# Patient Record
Sex: Male | Born: 1955
Health system: Southern US, Community
[De-identification: ages and names within clinical notes are randomized; demographics above are authoritative.]

## PROBLEM LIST (undated history)

## (undated) DIAGNOSIS — E785 Hyperlipidemia, unspecified: Secondary | ICD-10-CM

## (undated) DIAGNOSIS — G709 Myoneural disorder, unspecified: Secondary | ICD-10-CM

## (undated) DIAGNOSIS — Z86010 Personal history of colonic polyps: Secondary | ICD-10-CM

## (undated) DIAGNOSIS — C801 Malignant (primary) neoplasm, unspecified: Secondary | ICD-10-CM

## (undated) DIAGNOSIS — M1990 Unspecified osteoarthritis, unspecified site: Secondary | ICD-10-CM

## (undated) DIAGNOSIS — H269 Unspecified cataract: Secondary | ICD-10-CM

## (undated) HISTORY — DX: Unspecified osteoarthritis, unspecified site: M19.90

## (undated) HISTORY — DX: Personal history of colonic polyps: Z86.010

## (undated) HISTORY — PX: OTHER SURGICAL HISTORY: SHX169

## (undated) HISTORY — DX: Myoneural disorder, unspecified: G70.9

## (undated) HISTORY — PX: TOOTH EXTRACTION: SUR596

## (undated) HISTORY — PX: VASECTOMY: SHX75

## (undated) HISTORY — DX: Malignant (primary) neoplasm, unspecified: C80.1

## (undated) HISTORY — PX: COLONOSCOPY: SHX174

## (undated) HISTORY — DX: Unspecified cataract: H26.9

## (undated) HISTORY — DX: Hyperlipidemia, unspecified: E78.5

---

## 1999-09-22 ENCOUNTER — Encounter: Payer: Self-pay | Admitting: Internal Medicine

## 1999-09-22 ENCOUNTER — Ambulatory Visit (HOSPITAL_COMMUNITY): Admission: RE | Admit: 1999-09-22 | Discharge: 1999-09-22 | Payer: Self-pay | Admitting: Internal Medicine

## 2006-05-07 ENCOUNTER — Ambulatory Visit: Payer: Self-pay | Admitting: Internal Medicine

## 2007-07-22 ENCOUNTER — Ambulatory Visit: Payer: Self-pay | Admitting: Internal Medicine

## 2007-07-22 LAB — CONVERTED CEMR LAB
ALT: 34 units/L (ref 0–53)
AST: 21 units/L (ref 0–37)
Albumin: 3.9 g/dL (ref 3.5–5.2)
Alkaline Phosphatase: 46 units/L (ref 39–117)
BUN: 14 mg/dL (ref 6–23)
Basophils Absolute: 0 10*3/uL (ref 0.0–0.1)
Basophils Relative: 0.3 % (ref 0.0–1.0)
Bilirubin Urine: NEGATIVE
Bilirubin, Direct: 0.2 mg/dL (ref 0.0–0.3)
Blood in Urine, dipstick: NEGATIVE
CO2: 31 meq/L (ref 19–32)
Calcium: 9 mg/dL (ref 8.4–10.5)
Chloride: 110 meq/L (ref 96–112)
Cholesterol: 216 mg/dL (ref 0–200)
Creatinine, Ser: 1 mg/dL (ref 0.4–1.5)
Direct LDL: 154.9 mg/dL
Eosinophils Absolute: 0.2 10*3/uL (ref 0.0–0.7)
Eosinophils Relative: 4.4 % (ref 0.0–5.0)
GFR calc Af Amer: 102 mL/min
GFR calc non Af Amer: 84 mL/min
Glucose, Bld: 92 mg/dL (ref 70–99)
Glucose, Urine, Semiquant: NEGATIVE
HCT: 38.4 % — ABNORMAL LOW (ref 39.0–52.0)
HDL: 46.9 mg/dL (ref >39.0)
Hemoglobin: 13.1 g/dL (ref 13.0–17.0)
Ketones, urine, test strip: NEGATIVE
Lymphocytes Relative: 30.2 % (ref 12.0–46.0)
MCHC: 34.1 g/dL (ref 30.0–36.0)
MCV: 86.6 fL (ref 78.0–100.0)
Monocytes Absolute: 0.3 10*3/uL (ref 0.1–1.0)
Monocytes Relative: 7.7 % (ref 3.0–12.0)
Neutro Abs: 2.6 10*3/uL (ref 1.4–7.7)
Neutrophils Relative %: 57.4 % (ref 43.0–77.0)
Nitrite: NEGATIVE
PSA: 1.65 ng/mL (ref 0.10–4.00)
Platelets: 218 10*3/uL (ref 150–400)
Potassium: 5.1 meq/L (ref 3.5–5.1)
Protein, U semiquant: NEGATIVE
RBC: 4.43 M/uL (ref 4.22–5.81)
RDW: 11.8 % (ref 11.5–14.6)
Sodium: 145 meq/L (ref 135–145)
Specific Gravity, Urine: 1.01
TSH: 1.08 microintl units/mL (ref 0.35–5.50)
Total Bilirubin: 0.9 mg/dL (ref 0.3–1.2)
Total CHOL/HDL Ratio: 4.6
Total Protein: 5.9 g/dL — ABNORMAL LOW (ref 6.0–8.3)
Triglycerides: 94 mg/dL (ref 0–149)
Urobilinogen, UA: 0.2
VLDL: 19 mg/dL (ref 0–40)
WBC Urine, dipstick: NEGATIVE
WBC: 4.4 10*3/uL — ABNORMAL LOW (ref 4.5–10.5)
pH: 6.5

## 2007-08-01 ENCOUNTER — Ambulatory Visit: Payer: Self-pay | Admitting: Internal Medicine

## 2007-08-01 DIAGNOSIS — M722 Plantar fascial fibromatosis: Secondary | ICD-10-CM | POA: Insufficient documentation

## 2007-08-05 ENCOUNTER — Telehealth: Payer: Self-pay | Admitting: Internal Medicine

## 2007-08-30 ENCOUNTER — Telehealth: Payer: Self-pay | Admitting: Internal Medicine

## 2007-10-08 ENCOUNTER — Ambulatory Visit: Payer: Self-pay | Admitting: Internal Medicine

## 2007-10-22 ENCOUNTER — Ambulatory Visit: Payer: Self-pay | Admitting: Internal Medicine

## 2007-10-22 LAB — HM COLONOSCOPY: HM COLON: NORMAL

## 2009-07-14 ENCOUNTER — Ambulatory Visit: Payer: Self-pay | Admitting: Internal Medicine

## 2010-05-17 NOTE — Assessment & Plan Note (Signed)
Summary: eye inf/cjr   Vital Signs:  Patient profile:   55 year old male Weight:      176 pounds Temp:     97.6 degrees F oral BP sitting:   108 / 70  (right arm) Cuff size:   regular  Vitals Entered By: Duard Brady LPN (July 14, 2009 10:20 AM) CC: c/o (R) eye redness and drainage since Sunday - no injury or scratch/rubbing noted Is Patient Diabetic? No   CC:  c/o (R) eye redness and drainage since Sunday - no injury or scratch/rubbing noted.  History of Present Illness: 55 year old patient with a 3-day history of redness and excessive tearing from the right eye.  The eye has a slight foreign body sensation, but no pain, change in visual acuity or photophobia.  He has had no activities that puts him at high risk for a foreign body.  Preventive Screening-Counseling & Management  Alcohol-Tobacco     Smoking Status: never  Allergies (verified): No Known Drug Allergies  Past History:  Past Medical History: Reviewed history from 08/01/2007 and no changes required. mitral valve prolapse  Review of Systems  The patient denies anorexia, fever, weight loss, weight gain, vision loss, decreased hearing, hoarseness, chest pain, syncope, dyspnea on exertion, peripheral edema, prolonged cough, headaches, hemoptysis, abdominal pain, melena, hematochezia, severe indigestion/heartburn, hematuria, incontinence, genital sores, muscle weakness, suspicious skin lesions, transient blindness, difficulty walking, depression, unusual weight change, abnormal bleeding, enlarged lymph nodes, angioedema, breast masses, and testicular masses.    Physical Exam  General:  Well-developed,well-nourished,in no acute distress; alert,appropriate and cooperative throughout examination Head:  Normocephalic and atraumatic without obvious abnormalities. No apparent alopecia or balding. Eyes:  left eye was normal except for some small lenticular opacities; diffuse conjunctiva injection noted on the right.   No exudate noted.  Pupillary responses were normal; excessive tearing noted Ears:  External ear exam shows no significant lesions or deformities.  Otoscopic examination reveals clear canals, tympanic membranes are intact bilaterally without bulging, retraction, inflammation or discharge. Hearing is grossly normal bilaterally. Mouth:  Oral mucosa and oropharynx without lesions or exudates.  Teeth in good repair.   Impression & Recommendations:  Problem # 1:  CONJUNCTIVITIS (ICD-372.30)  His updated medication list for this problem includes:    Neomycin-polymyxin-gramicidin Soln (Neomycin-polymyx-gramicid) ..... 2 gtts  od four times daily  Complete Medication List: 1)  Neomycin-polymyxin-gramicidin Soln (Neomycin-polymyx-gramicid) .... 2 gtts  od four times daily  Patient Instructions: 1)  Please schedule a follow-up appointment as needed. 2)  ophthalmic drops as prescribed 3)  call if  eye  pain, redness, or change in visual acuity Prescriptions: NEOMYCIN-POLYMYXIN-GRAMICIDIN  SOLN (NEOMYCIN-POLYMYX-GRAMICID) 2 gtts  OD four times daily  #10 cc x 1   Entered and Authorized by:   Hershey Knauer F Ashleen Demma  MD   Signed by:   Ilyssa Grennan F Summerlynn Glauser  MD on 07/14/2009   Method used:   Electronically to        [facility]  [address]  [id number] ([facility])       [address] [place], [place]  [address]       Ph: [phone] or [phone]       Fax: [phone]   RxID:   [id number] NEOMYCIN-POLYMYXIN-GRAMICIDIN  SOLN (NEOMYCIN-POLYMYX-GRAMICID) 2 gtts  OD four times daily  #10 cc x 0   Entered and Authorized by:   Gordy Savers  MD   Signed by:   Gordy Savers  MD on 07/14/2009  Method used:   Print then Give to Patient   RxID:   [id number]

## 2012-07-20 ENCOUNTER — Emergency Department (HOSPITAL_COMMUNITY)
Admission: EM | Admit: 2012-07-20 | Discharge: 2012-07-20 | Disposition: A | Discharge status: Home / Self Care | Payer: 59 | Source: Home / Self Care | Attending: Emergency Medicine | Admitting: Emergency Medicine

## 2012-07-20 ENCOUNTER — Emergency Department (INDEPENDENT_AMBULATORY_CARE_PROVIDER_SITE_OTHER): Payer: 59

## 2012-07-20 ENCOUNTER — Encounter (HOSPITAL_COMMUNITY): Payer: Self-pay | Admitting: Emergency Medicine

## 2012-07-20 DIAGNOSIS — J40 Bronchitis, not specified as acute or chronic: Secondary | ICD-10-CM

## 2012-07-20 MED ORDER — HYDROCOD POLST-CHLORPHEN POLST 10-8 MG/5ML PO LQCR: 5.0000 mL | Freq: Every evening | ORAL | Status: DC | PRN

## 2012-07-20 MED ORDER — AZITHROMYCIN 250 MG PO TABS: ORAL_TABLET | ORAL | Status: DC

## 2012-07-20 MED ORDER — ALBUTEROL SULFATE HFA 108 (90 BASE) MCG/ACT IN AERS: 1.0000 | INHALATION_SPRAY | Freq: Four times a day (QID) | RESPIRATORY_TRACT | Status: DC | PRN

## 2012-07-20 NOTE — ED Notes (Signed)
Reports onset two weeks ago of cough and fever, aches and pains, and congestion. All bt cough have resolved.  Reports nagging, persistent cough.

## 2012-07-22 NOTE — ED Provider Notes (Signed)
History     CSN: [id number]  Arrival date & time 07/20/12  1147   First MD Initiated Contact with Patient 07/20/12 1234      Chief Complaint  Patient presents with  . Cough    HPI: Patient is a 57 y.o. male presenting with cough. The history is provided by the patient.  Cough Cough characteristics:  Productive and harsh Sputum characteristics:  Unable to specify Severity:  Moderate Onset quality:  Gradual Duration:  2 weeks Timing:  Intermittent Progression:  Unchanged Chronicity:  New Smoker: no   Context: sick contacts and upper respiratory infection   Context: not animal exposure, not exposure to allergens, not fumes, not occupational exposure, not smoke exposure, not weather changes and not with activity   Relieved by:  Nothing Worsened by:  Deep breathing Ineffective treatments:  None tried Associated symptoms: no chest pain, no chills, no ear pain, no fever, no headaches, no myalgias, no rhinorrhea, no shortness of breath, no sinus congestion, no sore throat and no wheezing   Risk factors: recent infection   Pt reports he and his wife both had flu-like symptoms approx 2 weeks ago that included body aches, fever, congestion and cough. His wife's symptoms resolved completely after a few days and all of pt's symptoms resolved except his cough. Reports persistent, frequent cough that is at times productive and worse at night. Has not had fever in several days.   History reviewed. No pertinent past medical history.  Past Surgical History  Procedure Laterality Date  . Tooth extraction      No family history on file.  History  Substance Use Topics  . Smoking status: Never Smoker   . Smokeless tobacco: Not on file  . Alcohol Use: Yes      Review of Systems  Constitutional: Negative for fever and chills.  HENT: Negative for ear pain, congestion, sore throat, rhinorrhea, sneezing, postnasal drip and sinus pressure.   Eyes: Negative.   Respiratory: Positive for  cough. Negative for shortness of breath and wheezing.   Cardiovascular: Negative for chest pain.  Gastrointestinal: Negative for nausea, vomiting, abdominal pain, diarrhea and abdominal distention.  Endocrine: Negative.   Genitourinary: Negative.   Musculoskeletal: Negative.  Negative for myalgias.  Skin: Negative.   Allergic/Immunologic: Negative.   Neurological: Negative.  Negative for headaches.  Hematological: Negative.   Psychiatric/Behavioral: Negative.     Allergies  Review of patient's allergies indicates no known allergies.  Home Medications   Current Outpatient Rx  Name  Route  Sig  Dispense  Refill  . aspirin 81 MG tablet   Oral   Take 81 mg by mouth daily.         . Naproxen Sodium (ALEVE PO)   Oral   Take by mouth.         Marland Kitchen OVER THE COUNTER MEDICATION      Cough medicine.         Marland Kitchen albuterol (PROVENTIL HFA;VENTOLIN HFA) 108 (90 BASE) MCG/ACT inhaler   Inhalation   Inhale 1-2 puffs into the lungs every 6 (six) hours as needed for wheezing or shortness of breath (and or cough).   1 Inhaler   0   . azithromycin (ZITHROMAX Z-PAK) 250 MG tablet      Take 2 tabs on day 1 then 2 tabs on days 2-5   6 tablet   0   . chlorpheniramine-HYDROcodone (TUSSIONEX PENNKINETIC ER) 10-8 MG/5ML LQCR   Oral   Take 5 mLs by mouth at  bedtime as needed.   50 mL   0     BP 111/68  Pulse 82  Temp(Src) 99.1 F (37.3 C) (Oral)  Resp 16  SpO2 100%  Physical Exam  Nursing note and vitals reviewed. Constitutional: He is oriented to person, place, and time. He appears well-developed and well-nourished. No distress.  HENT:  Head: Normocephalic and atraumatic.  Right Ear: External ear normal.  Left Ear: External ear normal.  Nose: Nose normal.  Mouth/Throat: Oropharynx is clear and moist.  Neck: Neck supple.  Cardiovascular: Normal rate and regular rhythm.   Pulmonary/Chest: Effort normal and breath sounds normal.  Musculoskeletal: Normal range of motion.   Neurological: He is alert and oriented to person, place, and time.  Skin: Skin is warm and dry.  Psychiatric: He has a normal mood and affect.    ED Course  Procedures (including critical care time)  Labs Reviewed - No data to display No results found.   1. Bronchitis       MDM  Persistent cough s/p recent URI. Productive at times. Worse at night. Non-smoker. No fever in several days. All other sx's have resolved. CXR negative. Will treat for Bronchitis (Z-Pack, albuterol inhaler and short course of med for cough to use primarily at night. Pt is to f/u w/ PCP if not improving. Pt agreeable.        Leanne Chang, NP 07/22/12 [phone]

## 2012-07-23 NOTE — ED Provider Notes (Signed)
Medical screening examination/treatment/procedure(s) were performed by non-physician practitioner and as supervising physician I was immediately available for consultation/collaboration.  Ahamed Dorothie Wah, M.D.  Roe C Vuk Skillern, MD 07/23/12 1958 

## 2014-07-13 ENCOUNTER — Telehealth: Payer: Self-pay | Admitting: Internal Medicine

## 2014-07-13 NOTE — Telephone Encounter (Signed)
All ok

## 2014-07-13 NOTE — Telephone Encounter (Signed)
Pt has been sch

## 2014-07-13 NOTE — Telephone Encounter (Signed)
yes

## 2014-07-13 NOTE — Telephone Encounter (Signed)
Pt not seen in over 5+ yrs and is having upper back pain.  Would like to know if you will accept him back as pt and see him for that asap.

## 2014-07-13 NOTE — Telephone Encounter (Signed)
Pt would like to come in for back pain asap. Can I use sda slot for issue only and then pt will est later?

## 2014-07-14 ENCOUNTER — Ambulatory Visit (INDEPENDENT_AMBULATORY_CARE_PROVIDER_SITE_OTHER): Payer: BLUE CROSS/BLUE SHIELD | Admitting: Internal Medicine

## 2014-07-14 ENCOUNTER — Encounter: Payer: Self-pay | Admitting: Internal Medicine

## 2014-07-14 VITALS — BP 140/80 | HR 72 | Temp 98.0°F | Resp 20 | Ht 71.25 in | Wt 190.0 lb

## 2014-07-14 DIAGNOSIS — R29898 Other symptoms and signs involving the musculoskeletal system: Secondary | ICD-10-CM | POA: Diagnosis not present

## 2014-07-14 NOTE — Progress Notes (Signed)
Pre visit review using our clinic review tool, if applicable. No additional management support is needed unless otherwise documented below in the visit note. 

## 2014-07-14 NOTE — Patient Instructions (Addendum)
Cervical spine MRI as discussed  Call if any clinical change in your status  HOME CARE INSTRUCTIONS   Use a flat pillow when you sleep.  Only take over-the-counter or prescription medicines for pain, discomfort, or fever as directed by your caregiver.   SEEK IMMEDIATE MEDICAL CARE IF:  Your pain gets much worse and cannot be controlled with medicines.  You have weakness or numbness in your hand, arm, face, or leg.  You have a high fever or a stiff, rigid neck.  You lose bowel or bladder control (incontinence).  You have trouble with walking, balance, or speaking.

## 2014-07-14 NOTE — Progress Notes (Signed)
   Subjective:    Patient ID: John Hendricks, male    DOB: [birth year], 59 y.o.   MRN: [id number]  HPI  59 year old patient who presents with a two-week history of upper back discomfort.  He's also noticed some weakness and numbness involving his right arm.  No history of trauma, although he states he spends hours in a sofa in a awkward position typing before the onset of his symptoms.  He plays golf, but not in approximately 3 weeks.  He states that he does not feel he would be able to play golf due to his present limitations.  He describes weakness in the right hand grip, interfering with handwriting.  He also describes a lack of coordination with use of the right hand  Current Allergies: No known allergies   Past Medical History:  Reviewed history and no changes required:  mitral valve prolapse  Past Surgical History:  status post vasectomy 1985  status post tonsillectomy 1968   Family History:  father 70, and history Parkinson's disease, prostate cancer at age 46  mother, age 27, breast cancer, history of MI, age 31, status post stenting x 2    One sister in good health  Social History:  Married  Never Smoked  Ph.D. in geophysics    Two adult children, one is a first year Careers information officer at [employer]  Constitutional: Negative for fever, chills, appetite change and fatigue.  HENT: Negative for congestion, dental problem, ear pain, hearing loss, sore throat, tinnitus, trouble swallowing and voice change.   Eyes: Negative for pain, discharge and visual disturbance.  Respiratory: Negative for cough, chest tightness, wheezing and stridor.   Cardiovascular: Negative for chest pain, palpitations and leg swelling.  Gastrointestinal: Negative for nausea, vomiting, abdominal pain, diarrhea, constipation, blood in stool and abdominal distention.  Genitourinary: Negative for urgency, hematuria, flank pain, discharge, difficulty urinating  and genital sores.  Musculoskeletal: Negative for myalgias, back pain, joint swelling, arthralgias, gait problem and neck stiffness.  Skin: Negative for rash.  Neurological: Positive for weakness and numbness. Negative for dizziness, syncope, speech difficulty and headaches.  Hematological: Negative for adenopathy. Does not bruise/bleed easily.  Psychiatric/Behavioral: Negative for behavioral problems and dysphoric mood. The patient is not nervous/anxious.        Objective:   Physical Exam  Constitutional: He is oriented to person, place, and time. He appears well-developed and well-nourished. No distress.  Neurological: He is alert and oriented to person, place, and time.  Full range of motion of the head or neck stiffness; did Not tend aggravate shoulder or right upper back pain  Normal shoulder shrug The right biceps and triceps reflexes diminished Decreased right grip strength Slight hip.  This lesion involving the inner aspect of the right arm           Assessment & Plan:    Suspect cervical radiculopathy.  We'll set up for a prompt MRI.  Likely will need neurosurgical referral

## 2014-07-15 ENCOUNTER — Ambulatory Visit (HOSPITAL_COMMUNITY)
Admission: RE | Admit: 2014-07-15 | Discharge: 2014-07-15 | Disposition: A | Discharge status: Home / Self Care | Payer: BLUE CROSS/BLUE SHIELD | Source: Ambulatory Visit | Attending: Internal Medicine | Admitting: Internal Medicine

## 2014-07-15 DIAGNOSIS — M4802 Spinal stenosis, cervical region: Secondary | ICD-10-CM | POA: Diagnosis not present

## 2014-07-15 DIAGNOSIS — R29898 Other symptoms and signs involving the musculoskeletal system: Secondary | ICD-10-CM

## 2014-07-15 DIAGNOSIS — M542 Cervicalgia: Secondary | ICD-10-CM | POA: Diagnosis present

## 2014-07-16 ENCOUNTER — Other Ambulatory Visit: Payer: Self-pay | Admitting: Internal Medicine

## 2014-07-16 DIAGNOSIS — R202 Paresthesia of skin: Secondary | ICD-10-CM

## 2014-07-16 DIAGNOSIS — R29898 Other symptoms and signs involving the musculoskeletal system: Secondary | ICD-10-CM

## 2014-07-16 DIAGNOSIS — M79609 Pain in unspecified limb: Secondary | ICD-10-CM

## 2014-07-21 ENCOUNTER — Ambulatory Visit (INDEPENDENT_AMBULATORY_CARE_PROVIDER_SITE_OTHER): Payer: BLUE CROSS/BLUE SHIELD | Admitting: Neurology

## 2014-07-21 ENCOUNTER — Encounter: Payer: Self-pay | Admitting: Neurology

## 2014-07-21 VITALS — BP 118/78 | HR 78 | Ht 71.0 in | Wt 185.0 lb

## 2014-07-21 DIAGNOSIS — M792 Neuralgia and neuritis, unspecified: Secondary | ICD-10-CM

## 2014-07-21 DIAGNOSIS — G8320 Monoplegia of upper limb affecting unspecified side: Secondary | ICD-10-CM | POA: Diagnosis not present

## 2014-07-21 DIAGNOSIS — M5412 Radiculopathy, cervical region: Secondary | ICD-10-CM

## 2014-07-21 MED ORDER — GABAPENTIN 100 MG PO CAPS: 100.0000 mg | ORAL_CAPSULE | Freq: Three times a day (TID) | ORAL | Status: DC

## 2014-07-21 NOTE — Patient Instructions (Addendum)
1.  Start taking gabapentin 100 mg tablets     Morning       Afternoon        Evening   Day 1 -3                                  1 tab               Day 4-6  1 tab                   1 tab               Day 7-10 1 tab          1 tab            1 tab          Day 11-13 1 tab  1 tab         2 tab   Day 14-16 2 tab  1 tab         2 tab   Day 15-17 2 tab  2 tab         2 tab  If you develop increased sleepiness, stay at the lower dose.  If you feel pain is better controlled taking 300mg , please call my office so I can send you a new prescription for 300mg  tablets.  2.  EMG of the right arm - brachial plexus protocol  3.  Return to clinic in 2-4 weeks

## 2014-07-21 NOTE — Progress Notes (Signed)
[facility] Clinic Note - Initial Visit   Date: 07/21/2014   John Hendricks MRN: [id number] DOB: 07/14/1955   Dear Dr. Burnice Logan:  Thank you for your kind referral of John Hendricks for consultation of right arm pain and paresthesias. Although his history is well known to you, please allow [place] to reiterate it for the purpose of our medical record. The patient was accompanied to the clinic by self.   History of Present Illness: John Hendricks is a 59 y.o. right-handed Caucasian male with no prior medical history presenting for evaluation of right arm pain and paresthesias.    Starting in early March 2016, he developed sudden onset of severe pain involving the right shoulder and upper arm. Pain was very sharp, stabbing in quality and localized over the right shoulder blade and has persisted, although it has improved by 50%.  He has been taking Aleve, but this has not helped.    About a week later, he has numbness involving the right forearm and has developed difficulty with writing and holding a pencil.  He is dropping things and has difficulty with fine motor movements..  He does not have any neck discomfort.  It is worse with activity and improved by rest.  For instance, he was putting files a a cabinet and within a few minutes he has to stop because the motion aggravated his pain.  A few days preceding onset of symptoms, he spent about two days riding in a car and was typing for about 8-10 hours/day in that position.  Denies any trauma to the neck or shoulder.    He saw his PCP who ordered MRI cervical spine which showed mild disc bulge towards the left with mild biforaminal stenosis at C5-6, but this would not explain right arm symptoms so was referred for further evaluation.  Out-side paper records, electronic medical record, and images have been reviewed where available and summarized as:  Lab Results  Component Value Date   TSH 1.08 07/22/2007   MRI  cervical spine wo contrast 07/15/2014:   1. At C5-6 there is a mild broad-based disc bulge eccentric towards the left with mild bilateral foraminal narrowing.    Past Medical History:  None  Past Surgical History  Procedure Laterality Date  . Tooth extraction       Medications:  Current Outpatient Prescriptions on File Prior to Visit  Medication Sig Dispense Refill  . aspirin 81 MG tablet Take 81 mg by mouth daily.    Marland Kitchen ibuprofen (ADVIL,MOTRIN) 200 MG tablet Take 400 mg by mouth every 6 (six) hours as needed.     No current facility-administered medications on file prior to visit.    Allergies: No Known Allergies  Family History: History reviewed. No pertinent family history.  Social History: History   Social History  . Marital Status: Married    Spouse Name: N/A  . Number of Children: N/A  . Years of Education: N/A   Occupational History  . Not on file.   Social History Main Topics  . Smoking status: Never Smoker   . Smokeless tobacco: Never Used  . Alcohol Use: 4.2 oz/week    7 Standard drinks or equivalent per week  . Drug Use: No  . Sexual Activity: Not on file   Other Topics Concern  . Not on file   Social History Narrative    Review of Systems:  CONSTITUTIONAL: No fevers, chills, night sweats, or weight loss.   EYES: No  visual changes or eye pain ENT: No hearing changes.  No history of nose bleeds.   RESPIRATORY: No cough, wheezing and shortness of breath.   CARDIOVASCULAR: Negative for chest pain, and palpitations.   GI: Negative for abdominal discomfort, blood in stools or black stools.  No recent change in bowel habits.   GU:  No history of incontinence.   MUSCLOSKELETAL: No history of joint pain or swelling.  No myalgias.   SKIN: Negative for lesions, rash, and itching.   HEMATOLOGY/ONCOLOGY: Negative for prolonged bleeding, bruising easily, and swollen nodes.  No history of cancer.   ENDOCRINE: Negative for cold or heat intolerance, polydipsia  or goiter.   PSYCH:  No depression or anxiety symptoms.   NEURO: As Above.   Vital Signs:  BP 118/78 mmHg  Pulse 78  Ht 5\' 11"  (1.803 m)  Wt 185 lb (83.915 kg)  BMI 25.81 kg/m2  SpO2 98%   General Medical Exam:   General:  Well appearing, comfortable.   Eyes/ENT: see cranial nerve examination.   Neck: No masses appreciated.  Full range of motion without tenderness.  No carotid bruits. Respiratory:  Clear to auscultation, good air entry bilaterally.   Cardiac:  Regular rate and rhythm, no murmur.   Extremities:  No deformities, edema, or skin discoloration.  Skin:  No rashes or lesions.  Neurological Exam: MENTAL STATUS including orientation to time, place, person, recent and remote memory, attention span and concentration, language, and fund of knowledge is normal.  Speech is not dysarthric.  CRANIAL NERVES: II:  No visual field defects.  Unremarkable fundi.   III-IV-VI: Pupils equal round and reactive to light.  Normal conjugate, extra-ocular eye movements in all directions of gaze.  No nystagmus.  Left subtle ptosis (old).   V:  Normal facial sensation.     VII:  Normal facial symmetry and movements.  No pathologic facial reflexes.  VIII:  Normal hearing and vestibular function.   IX-X:  Normal palatal movement.   XI:  Normal shoulder shrug and head rotation.   XII:  Normal tongue strength and range of motion, no deviation or fasciculation.  MOTOR:  No atrophy, fasciculations or abnormal movements.  No pronator drift.  Tone is normal.    Right Upper Extremity:    Left Upper Extremity:    Deltoid  5/5   Deltoid  5/5   Biceps  5/5   Biceps  5/5   Triceps  5/5   Triceps  5/5   Wrist extensors  5/5   Wrist extensors  5/5   Wrist flexors  4+/5   Wrist flexors  5/5   Finger extensors  5-/5   Finger extensors  5/5   Finger flexors  4+/5   Finger flexors  5/5   Dorsal interossei  4+/5   Dorsal interossei  5-/5   Abductor pollicis  5/5   Abductor pollicis  5/5   Tone (Ashworth  scale)  0  Tone (Ashworth scale)  0   Right Lower Extremity:    Left Lower Extremity:    Hip flexors  5/5   Hip flexors  5/5   Hip extensors  5/5   Hip extensors  5/5   Knee flexors  5/5   Knee flexors  5/5   Knee extensors  5/5   Knee extensors  5/5   Dorsiflexors  5/5   Dorsiflexors  5/5   Plantarflexors  5/5   Plantarflexors  5/5   Toe extensors  5/5   Toe  extensors  5/5   Toe flexors  5/5   Toe flexors  5/5   Tone (Ashworth scale)  0  Tone (Ashworth scale)  0   MSRs:  Right                                                                 Left brachioradialis 2+  brachioradialis 2+  biceps 2+  biceps 2+  triceps 2+  triceps 2+  patellar 2+  patellar 2+  ankle jerk 2+  ankle jerk 2+  Hoffman no  Hoffman no  plantar response down  plantar response down   SENSORY: Reduced sensation to all modalities over the right medial antebrachial cutaneous nerve distribution, otherwise sensation intact throughout.  Romberg's sign absent.   COORDINATION/GAIT: Normal finger-to- nose-finger and heel-to-shin.  Intact rapid alternating movements bilaterally.  Able to rise from a chair without using arms.  Gait narrow based and stable. Tandem and stressed gait intact.    IMPRESSION: Mr. Dahlen is a 59 year-old presenting for evaluation of sudden onset of severe left shoulder pain followed by weakness and paresthesias.  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  I will order EMG of the right arm (brachial plexus protocol) and if this confirms the diagnosis, I will obtain imaging of the brachial plexus and plan to treat him with solumedrol since we are still early in his course of symptom presentation.  We discussed management options including starting gabapentin for pain and occupational therapy.  Titration schedule for gabapentin 100mg  was given with instructions to increase to 300mg  TID, if tolerable.   The duration of this appointment visit  was 45 minutes of face-to-face time with the patient.  Greater than 50% of this time was spent in counseling, explanation of diagnosis, planning of further management, and coordination of care.   Thank you for allowing me to participate in patient's care.  If I can answer any additional questions, I would be pleased to do so.    Sincerely,    Katheren Jimmerson K. Posey Pronto, DO

## 2014-07-23 ENCOUNTER — Ambulatory Visit (INDEPENDENT_AMBULATORY_CARE_PROVIDER_SITE_OTHER): Payer: BLUE CROSS/BLUE SHIELD | Admitting: Neurology

## 2014-07-23 ENCOUNTER — Other Ambulatory Visit: Payer: Self-pay | Admitting: *Deleted

## 2014-07-23 DIAGNOSIS — M25511 Pain in right shoulder: Secondary | ICD-10-CM

## 2014-07-23 DIAGNOSIS — M5412 Radiculopathy, cervical region: Secondary | ICD-10-CM | POA: Diagnosis not present

## 2014-07-23 DIAGNOSIS — G8320 Monoplegia of upper limb affecting unspecified side: Secondary | ICD-10-CM

## 2014-07-23 DIAGNOSIS — M792 Neuralgia and neuritis, unspecified: Secondary | ICD-10-CM

## 2014-07-23 NOTE — Procedures (Signed)
[facility]  [address]  [place], [place] [address] Tel: [phone] Fax:  [phone] Test Date:  07/23/2014  Patient: John Hendricks DOB: June 20, 1955 Physician: Narda Amber  Sex: Male Height: 5\' 11"  Ref Phys: Narda Amber  ID#: [id number] Temp: 32.0C Technician: Laureen Ochs R. NCS T.   Patient Complaints: Patient is a 59 year old male here for evaluation of his sudden severe right shoulder pain which is followed by paresthesias and weakness.  NCV & EMG Findings: Extensive electrodiagnostic testing of the right upper extremity and additional studies of the left shows:  1. Right median, ulnar, radial, and bilateral lateral antecubital brachial cutaneous sensory responses are within normal limits.  Bilateral medial antebrachial cutaneous sensory responses are symmetrically reduced, and may be technical in nature.  2. Right median and ulnar motor responses are reduced in amplitude, with preserved latency and normal conduction velocity.  3. Right ulnar F wave is within normal limits. 4. Needle electrode examination shows rapid recruitment pattern and involving the first dorsal interosseous, abductor pollicis brevis, extensor indices proprius, brachial radialis, and triceps muscles on the right side. Active motor axon loss changes are seen affecting the extensor indicis proprius, pronator teres, and brachioradialis muscles.  Impression: The electrophysiologic findings are most consistent with a multilevel intraspinal canal lesion affecting C7-T1 nerve root/level on the right side. With normal sensory responses, a brachial plexopathy or peripheral neuropathy is thought to be less likely.   ___________________________ Narda Amber    Nerve Conduction Studies Anti Sensory Summary Table   Stim Site NR Peak (ms) Norm Peak (ms) P-T Amp (V) Norm P-T Amp  Left Lat Ante Brach Cutan Anti Sensory (Lat Forearm)  36C  Lat Biceps    2.4 <2.9 21.4 >12  Right Lat Ante  Brach Cutan Anti Sensory (Lat Forearm)  36C  Lat Biceps    2.4 <2.9 20.9 >12  Left Med Ante Brach Cutan Anti Sensory (Med Forearm)  36C  Elbow    2.3  6.4   Right Med Ante Brach Cutan Anti Sensory (Med Forearm)  36C  Site 3    2.5  6.8   Right Median Anti Sensory (2nd Digit)  36C  Wrist    3.3 <3.6 31.1 >15  Right Radial Anti Sensory (Base 1st Digit)  36C  Wrist    2.3 <2.7 31.6 >14  Right Ulnar Anti Sensory (5th Digit)  36C  Wrist    2.6 <3.1 29.7 >10   Motor Summary Table   Stim Site NR Onset (ms) Norm Onset (ms) O-P Amp (mV) Norm O-P Amp Site1 Site2 Delta-0 (ms) Dist (cm) Vel (m/s) Norm Vel (m/s)  Right Median Motor (Abd Poll Brev)  36C    Stim ulnar wrist  Wrist    3.6 <4.0 2.1 >6 Elbow Wrist 5.2 32.0 62 >50  Elbow    8.8  3.7  Axilla Elbow 4.5 0.0    Axilla    4.3  1.9         Right Ulnar Motor (Abd Dig Minimi)  36C  Wrist    2.2 <3.1 5.9 >7 B Elbow Wrist 4.0 23.0 58 >50  B Elbow    6.2  5.8  A Elbow B Elbow 1.6 10.0 63 >50  A Elbow    7.8  5.6          F Wave Studies   NR F-Lat (ms) Lat Norm (ms) L-R F-Lat (ms)  Right Ulnar (Mrkrs) (Abd Dig Min)  36C  29.70 <33    EMG   Side Muscle Ins Act Fibs Psw Fasc Number Recrt Dur Dur. Amp Amp. Poly Poly. Comment  Right 1stDorInt Nml Nml Nml Nml 1- Rapid Nml Nml Nml Nml Nml Nml N/A  Right Abd Poll Brev Nml Nml Nml Nml 2- Rapid Nml Nml Nml Nml Nml Nml N/A  Right Ext Indicis Nml Nml 1+ Nml 1- Mod-R Nml Nml Nml Nml Nml Nml N/A  Right PronatorTeres Nml Nml 1+ Nml Nml Nml Nml Nml Nml Nml Nml Nml N/A  Right BrachioRad Nml Nml 1+ Nml 1- Mod-R Nml Nml Nml Nml Nml Nml N/A  Right Cervical Parasp Low Nml Nml Nml Nml Nml Nml Nml Nml Nml Nml Nml Nml N/A  Right Infraspinatus Nml Nml Nml Nml Nml Nml Nml Nml Nml Nml Nml Nml N/A  Right Deltoid Nml Nml Nml Nml Nml Nml Nml Nml Nml Nml Nml Nml N/A  Right Triceps Nml Nml Nml Nml Nml Nml Nml Nml Nml Nml Nml Nml N/A  Right Biceps Nml Nml Nml Nml Nml Nml Nml Nml Nml Nml Nml Nml N/A       Waveforms:

## 2014-07-24 ENCOUNTER — Other Ambulatory Visit: Payer: Self-pay | Admitting: *Deleted

## 2014-07-24 DIAGNOSIS — M5412 Radiculopathy, cervical region: Secondary | ICD-10-CM

## 2014-07-24 DIAGNOSIS — M25511 Pain in right shoulder: Secondary | ICD-10-CM

## 2014-07-24 DIAGNOSIS — M792 Neuralgia and neuritis, unspecified: Secondary | ICD-10-CM

## 2014-07-24 DIAGNOSIS — R531 Weakness: Secondary | ICD-10-CM

## 2014-07-24 DIAGNOSIS — G8320 Monoplegia of upper limb affecting unspecified side: Secondary | ICD-10-CM

## 2014-07-24 DIAGNOSIS — R202 Paresthesia of skin: Secondary | ICD-10-CM

## 2014-07-24 NOTE — Progress Notes (Signed)
Patient has already had MRI C-spine.  Dr. Posey Pronto notified.

## 2014-07-27 ENCOUNTER — Other Ambulatory Visit: Payer: Self-pay | Admitting: Neurology

## 2014-07-27 ENCOUNTER — Telehealth: Payer: Self-pay | Admitting: Neurology

## 2014-07-27 ENCOUNTER — Other Ambulatory Visit: Payer: Self-pay | Admitting: *Deleted

## 2014-07-27 LAB — RHEUMATOID FACTOR

## 2014-07-27 LAB — C-REACTIVE PROTEIN

## 2014-07-27 LAB — VITAMIN B12: VITAMIN B 12: 391 pg/mL (ref 211–911)

## 2014-07-27 MED ORDER — GABAPENTIN 300 MG PO CAPS: 300.0000 mg | ORAL_CAPSULE | Freq: Three times a day (TID) | ORAL | Status: DC

## 2014-07-27 NOTE — Telephone Encounter (Signed)
Pt stopped by the office to pick order and he wanted to change his gabapentin 100mg  to 300mg  instead. Please call pt # [phone]

## 2014-07-27 NOTE — Telephone Encounter (Signed)
Left message for patient to call me back. 

## 2014-07-27 NOTE — Telephone Encounter (Signed)
Patient is needing Rx for 300 mg capsules.  Rx sent.

## 2014-07-28 LAB — ANCA SCREEN W REFLEX TITER
Atypical p-ANCA Screen: NEGATIVE
P-ANCA SCREEN: NEGATIVE
c-ANCA Screen: NEGATIVE

## 2014-07-28 LAB — ENA 9 PANEL
CENTROMERE AB SCREEN: NEGATIVE
ENA SM AB SER-ACNC: NEGATIVE
Jo-1 Antibody, IgG: <1
Ribosomal P Protein Ab: <1
SCLERODERMA (SCL-70) (ENA) ANTIBODY, IGG: NEGATIVE
SM/RNP: <1
SSA (RO) (ENA) ANTIBODY, IGG: NEGATIVE
SSB (La) (ENA) Antibody, IgG: <1
ds DNA Ab: <1 IU/mL

## 2014-07-28 LAB — SEDIMENTATION RATE: SED RATE: 1 mm/h (ref 0–20)

## 2014-07-28 LAB — ANA: Anti Nuclear Antibody(ANA): NEGATIVE

## 2014-07-29 LAB — SPEP & IFE WITH QIG
ALPHA-1-GLOBULIN: 0.3 g/dL (ref 0.2–0.3)
ALPHA-2-GLOBULIN: 0.6 g/dL (ref 0.5–0.9)
Albumin ELP: 4.4 g/dL (ref 3.8–4.8)
BETA GLOBULIN: 0.4 g/dL (ref 0.4–0.6)
Beta 2: 0.2 g/dL (ref 0.2–0.5)
GAMMA GLOBULIN: 0.7 g/dL — AB (ref 0.8–1.7)
IGG (IMMUNOGLOBIN G), SERUM: 786 mg/dL (ref 650–1600)
IGM, SERUM: 41 mg/dL (ref 41–251)
IgA: 47 mg/dL — ABNORMAL LOW (ref 68–379)
TOTAL PROTEIN, SERUM ELECTROPHOR: 6.6 g/dL (ref 6.1–8.1)

## 2014-07-29 LAB — UIFE/LIGHT CHAINS/TP QN, 24-HR UR
ALPHA 1 UR: DETECTED — AB
Albumin, U: DETECTED
Alpha 2, Urine: DETECTED — AB
BETA UR: DETECTED — AB
GAMMA UR: DETECTED — AB
TOTAL PROTEIN, URINE-UPE24: 7 mg/dL (ref 5–25)

## 2014-07-29 LAB — COPPER, SERUM: Copper: 96 ug/dL (ref 70–175)

## 2014-08-01 LAB — CRYOGLOBULIN

## 2014-08-02 ENCOUNTER — Ambulatory Visit
Admission: RE | Admit: 2014-08-02 | Discharge: 2014-08-02 | Disposition: A | Discharge status: Home / Self Care | Payer: BLUE CROSS/BLUE SHIELD | Source: Ambulatory Visit | Attending: Neurology | Admitting: Neurology

## 2014-08-02 ENCOUNTER — Other Ambulatory Visit: Payer: BLUE CROSS/BLUE SHIELD

## 2014-08-02 DIAGNOSIS — M25511 Pain in right shoulder: Secondary | ICD-10-CM

## 2014-08-02 LAB — GANGLIOSIDE GM-1 ABS (IGG & IGM)
GM-1 Ab (IgG): <1:800 {titer}
GM-1 Ab (IgM): <1:800 {titer}

## 2014-08-02 MED ORDER — GADOBENATE DIMEGLUMINE 529 MG/ML IV SOLN
17.0000 mL | Freq: Once | INTRAVENOUS | Status: AC | PRN
  Administered 2014-08-02: 17 mL via INTRAVENOUS

## 2014-08-05 ENCOUNTER — Other Ambulatory Visit: Payer: BLUE CROSS/BLUE SHIELD

## 2014-09-07 ENCOUNTER — Ambulatory Visit (INDEPENDENT_AMBULATORY_CARE_PROVIDER_SITE_OTHER): Payer: BLUE CROSS/BLUE SHIELD | Admitting: Neurology

## 2014-09-07 ENCOUNTER — Encounter: Payer: Self-pay | Admitting: Neurology

## 2014-09-07 VITALS — BP 124/80 | HR 75 | Ht 71.0 in | Wt 187.3 lb

## 2014-09-07 DIAGNOSIS — G8320 Monoplegia of upper limb affecting unspecified side: Secondary | ICD-10-CM

## 2014-09-07 DIAGNOSIS — M5412 Radiculopathy, cervical region: Secondary | ICD-10-CM | POA: Diagnosis not present

## 2014-09-07 MED ORDER — GABAPENTIN 300 MG PO CAPS: 300.0000 mg | ORAL_CAPSULE | Freq: Three times a day (TID) | ORAL | Status: DC

## 2014-09-07 NOTE — Progress Notes (Signed)
Follow-up Visit   Date: 09/07/2014    John Hendricks MRN: [id number] DOB: 10-16-1955   Interim History: John Hendricks is a 59 y.o. right-handed Caucasian male with no prior medical history returning to the clinic for follow-up of right arm pain, paresthesias, and weakness.  The patient was accompanied to the clinic by self.  History of present illness: Starting in early March 2016, he developed sudden onset of severe pain involving the right shoulder and upper arm. Pain was very sharp, stabbing in quality and localized over the right shoulder blade and has persisted, although it has improved by 50%. He has been taking Aleve, but this has not helped.   About a week later, he has numbness involving the right forearm and has developed difficulty with writing and holding a pencil. He is dropping things and has difficulty with fine motor movements.. He does not have any neck discomfort. It is worse with activity and improved by rest. For instance, he was putting files a a cabinet and within a few minutes he has to stop because the motion aggravated his pain.  A few days preceding onset of symptoms, he spent about two days riding in a car and was typing for about 8-10 hours/day in that position. Denies any trauma to the neck or shoulder.   He saw his PCP who ordered MRI cervical spine which showed mild disc bulge towards the left with mild biforaminal stenosis at C5-6, but this would not explain right arm symptoms so was referred for further evaluation.  UPDATE 07/21/2014: Pain and paresthesias has improved.  He continues to have intermittent vibrating sensation occuring several times per day.  There is still weakness of the right hand, especially with opening jars, writing with a pencil, as well as other fine finger movements.  Overall, there has been no worsening of symptoms.  Medications:  Current Outpatient Prescriptions on File Prior to Visit  Medication Sig Dispense Refill  .  aspirin 81 MG tablet Take 81 mg by mouth daily.    Marland Kitchen gabapentin (NEURONTIN) 300 MG capsule Take 1 capsule (300 mg total) by mouth 3 (three) times daily. 90 capsule 5  . ibuprofen (ADVIL,MOTRIN) 200 MG tablet Take 400 mg by mouth every 6 (six) hours as needed.     No current facility-administered medications on file prior to visit.    Allergies: No Known Allergies  Review of Systems:  CONSTITUTIONAL: No fevers, chills, night sweats, or weight loss.  EYES: No visual changes or eye pain ENT: No hearing changes.  No history of nose bleeds.   RESPIRATORY: No cough, wheezing and shortness of breath.   CARDIOVASCULAR: Negative for chest pain, and palpitations.   GI: Negative for abdominal discomfort, blood in stools or black stools.  No recent change in bowel habits.   GU:  No history of incontinence.   MUSCLOSKELETAL: No history of joint pain or swelling.  No myalgias.   SKIN: Negative for lesions, rash, and itching.   ENDOCRINE: Negative for cold or heat intolerance, polydipsia or goiter.   PSYCH:  No depression or anxiety symptoms.   NEURO: As Above.   Vital Signs:  BP 124/80 mmHg  Pulse 75  Ht 5' 11" (1.803 m)  Wt 187 lb 5 oz (84.964 kg)  BMI 26.14 kg/m2  SpO2 99%  Neurological Exam: MENTAL STATUS including orientation to time, place, person, recent and remote memory, attention span and concentration, language, and fund of knowledge is normal.  Speech is not dysarthric.  CRANIAL NERVES:  Face is symmetric.  Subtle left ptosis.    MOTOR:  No atrophy, fasciculations or abnormal movements.  No pronator drift.  Tone is normal.    Right Upper Extremity:    Left Upper Extremity:    Deltoid  5/5   Deltoid  5/5   Biceps  5/5   Biceps  5/5   Triceps  5/5   Triceps  5/5   Wrist extensors  5/5   Wrist extensors  5/5   Wrist flexors  5-/5   Wrist flexors  5/5   Finger extensors  5-/5   Finger extensors  5/5   Finger flexors  5-/5   Finger flexors  5/5   Dorsal interossei  4+/5    Dorsal interossei  5/5   Abductor pollicis  5/5   Abductor pollicis  5/5   Tone (Ashworth scale)  0  Tone (Ashworth scale)  0   Right Lower Extremity:    Left Lower Extremity:    Hip flexors  5/5   Hip flexors  5/5   Hip extensors  5/5   Hip extensors  5/5   Knee flexors  5/5   Knee flexors  5/5   Knee extensors  5/5   Knee extensors  5/5   Dorsiflexors  5/5   Dorsiflexors  5/5   Plantarflexors  5/5   Plantarflexors  5/5   Toe extensors  5/5   Toe extensors  5/5   Toe flexors  5/5   Toe flexors  5/5   Tone (Ashworth scale)  0  Tone (Ashworth scale)  0     MSRs:  Reflexes are 2+/4 throughout  SENSORY:  Intact to light touch, vibration, and temperature in the upper extremities.  COORDINATION/GAIT:  Normal finger-to- nose-finger and heel-to-shin.  Intact rapid alternating movements bilaterally.  Gait narrow based and stable.   Data: Labs 07/27/2014:  ESR 1, CRP <0.5, ANA neg, RF <10, ENA neg, GM1 antibody neg, c/p-ANCA neg, copper 96, vitamin B12 391, cryoglobulins negative, SPEP/UPEP with IFE no M protein,   EMG 07/23/2014:  The electrophysiologic findings are most consistent with a multilevel intraspinal canal lesion affecting C7-T1 nerve root/level on the right side. With normal sensory responses, a brachial plexopathy or peripheral neuropathy is thought to be less likely.  MRI brachial plexus 08/02/2014:  Normal  MRI cervical spine wo contrast 07/15/2014:  1. At C5-6 there is a mild broad-based disc bulge eccentric towards the left with mild bilateral foraminal narrowing.  IMPRESSION/PLAN: Mr. 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  However, EMG and MRI did not find any specific problems of the brachial plexus, but did show neurogenic process affecting the right C7-T1 nerve roots. Because he was very early in the course of this process when he presented, solumedrol was offered, but he  declined.    Clinically, there is improved sensation of the right hand with only intermittent paresthesias over the forearm, but he continues to have right hand weakness, especially with finger/wrist flexion, finger abduction, and extension.  I recommend that he start occupational therapy, but he declined and prefers to do his own therapy at home.  Because pain is improved, reduce gabapentin to twice daily.  Return to clinic in 17-month or sooner as needed.    The duration of this appointment visit was 25 minutes of face-to-face time with the patient.  Greater than 50% of this time was  spent in counseling, explanation of diagnosis, planning of further management, and coordination of care.   Thank you for allowing me to participate in patient's care.  If I can answer any additional questions, I would be pleased to do so.    Sincerely,    Donika K. Posey Pronto, DO

## 2014-09-07 NOTE — Patient Instructions (Signed)
1.  Reduce gabapentin 300mg  to one tablet twice daily 2.  Start home exercise program for hand weakness 3.  Should you decide to do a directed occupational therapy program, please call my office so we can send the referral 4.  Return to clinic in 6 months

## 2014-09-08 ENCOUNTER — Encounter: Payer: Self-pay | Admitting: Internal Medicine

## 2014-09-08 ENCOUNTER — Ambulatory Visit (INDEPENDENT_AMBULATORY_CARE_PROVIDER_SITE_OTHER): Payer: BLUE CROSS/BLUE SHIELD | Admitting: Internal Medicine

## 2014-09-08 VITALS — BP 140/90 | HR 66 | Temp 97.5°F | Resp 18 | Ht 71.5 in | Wt 189.0 lb

## 2014-09-08 DIAGNOSIS — R7989 Other specified abnormal findings of blood chemistry: Secondary | ICD-10-CM | POA: Diagnosis not present

## 2014-09-08 DIAGNOSIS — Z0000 Encounter for general adult medical examination without abnormal findings: Secondary | ICD-10-CM

## 2014-09-08 LAB — COMPREHENSIVE METABOLIC PANEL
ALT: 31 U/L (ref 0–53)
AST: 19 U/L (ref 0–37)
Albumin: 4.4 g/dL (ref 3.5–5.2)
Alkaline Phosphatase: 58 U/L (ref 39–117)
BUN: 16 mg/dL (ref 6–23)
CO2: 33 mEq/L — ABNORMAL HIGH (ref 19–32)
CREATININE: 0.97 mg/dL (ref 0.40–1.50)
Calcium: 9.5 mg/dL (ref 8.4–10.5)
Chloride: 104 mEq/L (ref 96–112)
GFR: 84.67 mL/min (ref >60.00)
GLUCOSE: 97 mg/dL (ref 70–99)
POTASSIUM: 4.6 meq/L (ref 3.5–5.1)
SODIUM: 139 meq/L (ref 135–145)
TOTAL PROTEIN: 6.6 g/dL (ref 6.0–8.3)
Total Bilirubin: 0.5 mg/dL (ref 0.2–1.2)

## 2014-09-08 LAB — CBC WITH DIFFERENTIAL/PLATELET
BASOS ABS: 0 10*3/uL (ref 0.0–0.1)
BASOS PCT: 0.6 % (ref 0.0–3.0)
EOS ABS: 0.3 10*3/uL (ref 0.0–0.7)
Eosinophils Relative: 5.4 % — ABNORMAL HIGH (ref 0.0–5.0)
HEMATOCRIT: 44.9 % (ref 39.0–52.0)
HEMOGLOBIN: 15.6 g/dL (ref 13.0–17.0)
LYMPHS PCT: 31.7 % (ref 12.0–46.0)
Lymphs Abs: 1.9 10*3/uL (ref 0.7–4.0)
MCHC: 34.7 g/dL (ref 30.0–36.0)
MCV: 85.5 fl (ref 78.0–100.0)
MONOS PCT: 7.7 % (ref 3.0–12.0)
Monocytes Absolute: 0.5 10*3/uL (ref 0.1–1.0)
NEUTROS PCT: 54.6 % (ref 43.0–77.0)
Neutro Abs: 3.3 10*3/uL (ref 1.4–7.7)
Platelets: 239 10*3/uL (ref 150.0–400.0)
RBC: 5.25 Mil/uL (ref 4.22–5.81)
RDW: 12.9 % (ref 11.5–15.5)
WBC: 6.1 10*3/uL (ref 4.0–10.5)

## 2014-09-08 LAB — TSH: TSH: 1.39 u[IU]/mL (ref 0.35–4.50)

## 2014-09-08 LAB — LIPID PANEL
CHOLESTEROL: 226 mg/dL — AB (ref 0–200)
HDL: 56 mg/dL (ref >39.00)
NONHDL: 170
Total CHOL/HDL Ratio: 4
Triglycerides: 247 mg/dL — ABNORMAL HIGH (ref 0.0–149.0)
VLDL: 49.4 mg/dL — AB (ref 0.0–40.0)

## 2014-09-08 LAB — LDL CHOLESTEROL, DIRECT: Direct LDL: 144 mg/dL

## 2014-09-08 NOTE — Progress Notes (Signed)
Pre visit review using our clinic review tool, if applicable. No additional management support is needed unless otherwise documented below in the visit note. 

## 2014-09-08 NOTE — Patient Instructions (Addendum)
It is important that you exercise regularly, at least 20 minutes 3 to 4 times per week.  If you develop chest pain or shortness of breath seek  medical attention.  Health Maintenance A healthy lifestyle and preventative care can promote health and wellness.  Maintain regular health, dental, and eye exams.  Eat a healthy diet. Foods like vegetables, fruits, whole grains, low-fat dairy products, and lean protein foods contain the nutrients you need and are low in calories. Decrease your intake of foods high in solid fats, added sugars, and salt. Get information about a proper diet from your health care provider, if necessary.  Regular physical exercise is one of the most important things you can do for your health. Most adults should get at least 150 minutes of moderate-intensity exercise (any activity that increases your heart rate and causes you to sweat) each week. In addition, most adults need muscle-strengthening exercises on 2 or more days a week.   Maintain a healthy weight. The body mass index (BMI) is a screening tool to identify possible weight problems. It provides an estimate of body fat based on height and weight. Your health care provider can find your BMI and can help you achieve or maintain a healthy weight. For males 20 years and older:  A BMI below 18.5 is considered underweight.  A BMI of 18.5 to 24.9 is normal.  A BMI of 25 to 29.9 is considered overweight.  A BMI of 30 and above is considered obese.  Maintain normal blood lipids and cholesterol by exercising and minimizing your intake of saturated fat. Eat a balanced diet with plenty of fruits and vegetables. Blood tests for lipids and cholesterol should begin at age 20 and be repeated every 5 years. If your lipid or cholesterol levels are high, you are over age 50, or you are at high risk for heart disease, you may need your cholesterol levels checked more frequently.Ongoing high lipid and cholesterol levels should be  treated with medicines if diet and exercise are not working.  If you smoke, find out from your health care provider how to quit. If you do not use tobacco, do not start.  Lung cancer screening is recommended for adults aged 55-80 years who are at high risk for developing lung cancer because of a history of smoking. A yearly low-dose CT scan of the lungs is recommended for people who have at least a 30-pack-year history of smoking and are current smokers or have quit within the past 15 years. A pack year of smoking is smoking an average of 1 pack of cigarettes a day for 1 year (for example, a 30-pack-year history of smoking could mean smoking 1 pack a day for 30 years or 2 packs a day for 15 years). Yearly screening should continue until the smoker has stopped smoking for at least 15 years. Yearly screening should be stopped for people who develop a health problem that would prevent them from having lung cancer treatment.  If you choose to drink alcohol, do not have more than 2 drinks per day. One drink is considered to be 12 oz (360 mL) of beer, 5 oz (150 mL) of wine, or 1.5 oz (45 mL) of liquor.  Avoid the use of street drugs. Do not share needles with anyone. Ask for help if you need support or instructions about stopping the use of drugs.  High blood pressure causes heart disease and increases the risk of stroke. Blood pressure should be checked at least every   1-2 years. Ongoing high blood pressure should be treated with medicines if weight loss and exercise are not effective.  If you are 45-79 years old, ask your health care provider if you should take aspirin to prevent heart disease.  Diabetes screening involves taking a blood sample to check your fasting blood sugar level. This should be done once every 3 years after age 45 if you are at a normal weight and without risk factors for diabetes. Testing should be considered at a younger age or be carried out more frequently if you are overweight and  have at least 1 risk factor for diabetes.  Colorectal cancer can be detected and often prevented. Most routine colorectal cancer screening begins at the age of 50 and continues through age 75. However, your health care provider may recommend screening at an earlier age if you have risk factors for colon cancer. On a yearly basis, your health care provider may provide home test kits to check for hidden blood in the stool. A small camera at the end of a tube may be used to directly examine the colon (sigmoidoscopy or colonoscopy) to detect the earliest forms of colorectal cancer. Talk to your health care provider about this at age 50 when routine screening begins. A direct exam of the colon should be repeated every 5-10 years through age 75, unless early forms of precancerous polyps or small growths are found.  People who are at an increased risk for hepatitis B should be screened for this virus. You are considered at high risk for hepatitis B if:  You were born in a country where hepatitis B occurs often. Talk with your health care provider about which countries are considered high risk.  Your parents were born in a high-risk country and you have not received a shot to protect against hepatitis B (hepatitis B vaccine).  You have HIV or AIDS.  You use needles to inject street drugs.  You live with, or have sex with, someone who has hepatitis B.  You are a man who has sex with other men (MSM).  You get hemodialysis treatment.  You take certain medicines for conditions like cancer, organ transplantation, and autoimmune conditions.  Hepatitis C blood testing is recommended for all people born from 1945 through 1965 and any individual with known risk factors for hepatitis C.  Healthy men should no longer receive prostate-specific antigen (PSA) blood tests as part of routine cancer screening. Talk to your health care provider about prostate cancer screening.  Testicular cancer screening is not  recommended for adolescents or adult males who have no symptoms. Screening includes self-exam, a health care provider exam, and other screening tests. Consult with your health care provider about any symptoms you have or any concerns you have about testicular cancer.  Practice safe sex. Use condoms and avoid high-risk sexual practices to reduce the spread of sexually transmitted infections (STIs).  You should be screened for STIs, including gonorrhea and chlamydia if:  You are sexually active and are younger than 24 years.  You are older than 24 years, and your health care provider tells you that you are at risk for this type of infection.  Your sexual activity has changed since you were last screened, and you are at an increased risk for chlamydia or gonorrhea. Ask your health care provider if you are at risk.  If you are at risk of being infected with HIV, it is recommended that you take a prescription medicine daily to prevent HIV   infection. This is called pre-exposure prophylaxis (PrEP). You are considered at risk if:  You are a man who has sex with other men (MSM).  You are a heterosexual man who is sexually active with multiple partners.  You take drugs by injection.  You are sexually active with a partner who has HIV.  Talk with your health care provider about whether you are at high risk of being infected with HIV. If you choose to begin PrEP, you should first be tested for HIV. You should then be tested every 3 months for as long as you are taking PrEP.  Use sunscreen. Apply sunscreen liberally and repeatedly throughout the day. You should seek shade when your shadow is shorter than you. Protect yourself by wearing long sleeves, pants, a wide-brimmed hat, and sunglasses year round whenever you are outdoors.  Tell your health care provider of new moles or changes in moles, especially if there is a change in shape or color. Also, tell your health care provider if a mole is larger  than the size of a pencil eraser.  A one-time screening for abdominal aortic aneurysm (AAA) and surgical repair of large AAAs by ultrasound is recommended for men aged 65-75 years who are current or former smokers.  Stay current with your vaccines (immunizations). Document Released: 09/30/2007 Document Revised: 04/08/2013 Document Reviewed: 08/29/2010 Exit[employer] Patient Information 2015 [employer]. This information is not intended to replace advice given to you by your health care provider. Make sure you discuss any questions you have with your health care provider.  

## 2014-09-08 NOTE — Progress Notes (Signed)
Subjective:    Patient ID: John Hendricks, male    DOB: [birth year], 59 y.o.   MRN: [id number]  HPI  59 year old patient who is seen today for a preventive health examination.  He has had a recent neurology evaluation due to a right brachial neuritis , which is improving.  No concerns or complaints   Current Allergies: No known allergies   Past Medical History:  Reviewed history and no changes required:  mitral valve prolapse  right brachial neuritis  Past Surgical History:  status post vasectomy 1985  status post tonsillectomy 1968  Colonoscopy 2009 good right now.    Family History:  father  Died in his eighties;   history Parkinson's disease, prostate cancer at age 4  mother, age 72, breast cancer, history of MI, age 69, status post stenting x 2;  Status post aVR repair;  history of dementia    One sister in good health  Social History:  Married  Never Smoked  Ph.D. in geophysics    Two adult children, one  son is an Art gallery manager;  Daughter is a Administrator, sports at [place] Factors:  Tobacco use: never    Review of Systems  Constitutional: Negative for fever, chills, activity change, appetite change and fatigue.  HENT: Negative for congestion, dental problem, ear pain, hearing loss, mouth sores, rhinorrhea, sinus pressure, sneezing, tinnitus, trouble swallowing and voice change.   Eyes: Negative for photophobia, pain, redness and visual disturbance.  Respiratory: Negative for apnea, cough, choking, chest tightness, shortness of breath and wheezing.   Cardiovascular: Negative for chest pain, palpitations and leg swelling.  Gastrointestinal: Negative for nausea, vomiting, abdominal pain, diarrhea, constipation, blood in stool, abdominal distention, anal bleeding and rectal pain.  Genitourinary: Negative for dysuria, urgency, frequency, hematuria, flank pain, decreased urine volume, discharge, penile  swelling, scrotal swelling, difficulty urinating, genital sores and testicular pain.  Musculoskeletal: Negative for myalgias, back pain, joint swelling, arthralgias, gait problem, neck pain and neck stiffness.  Skin: Negative for color change, rash and wound.  Neurological: Positive for weakness and numbness. Negative for dizziness, tremors, seizures, syncope, facial asymmetry, speech difficulty, light-headedness and headaches.  Hematological: Negative for adenopathy. Does not bruise/bleed easily.  Psychiatric/Behavioral: Negative for suicidal ideas, hallucinations, behavioral problems, confusion, sleep disturbance, self-injury, dysphoric mood, decreased concentration and agitation. The patient is not nervous/anxious.        Objective:   Physical Exam  Constitutional: He appears well-developed and well-nourished.  HENT:  Head: Normocephalic and atraumatic.  Right Ear: External ear normal.  Left Ear: External ear normal.  Nose: Nose normal.  Mouth/Throat: Oropharynx is clear and moist.  Eyes: Conjunctivae and EOM are normal. Pupils are equal, round, and reactive to light. No scleral icterus.  Neck: Normal range of motion. Neck supple. No JVD present. No thyromegaly present.  Cardiovascular: Regular rhythm, normal heart sounds and intact distal pulses.  Exam reveals no gallop and no friction rub.   No murmur heard. Pulmonary/Chest: Effort normal and breath sounds normal. He exhibits no tenderness.  Abdominal: Soft. Bowel sounds are normal. He exhibits no distension and no mass. There is no tenderness.  Genitourinary: Prostate normal and penis normal. Guaiac negative stool.  Musculoskeletal: Normal range of motion. He exhibits no edema or tenderness.  Lymphadenopathy:    He has no cervical adenopathy.  Neurological: He is alert. He has normal reflexes. No cranial nerve deficit. Coordination normal.  Right biceps reflex minimally diminished compared to the left  Skin: Skin  is warm and dry.  No rash noted.  Psychiatric: He has a normal mood and affect. His behavior is normal.          Assessment & Plan:   Preventive health examination.  Heart healthy diet more regular exercise and modest weight loss.  All encouraged Right brachial neuritis improving.  We'll continue home physical therapy  We'll check updated lab Return one year

## 2014-10-12 ENCOUNTER — Encounter: Payer: Self-pay | Admitting: Internal Medicine

## 2015-03-15 ENCOUNTER — Ambulatory Visit: Payer: BLUE CROSS/BLUE SHIELD | Admitting: Neurology

## 2015-07-20 DIAGNOSIS — H2511 Age-related nuclear cataract, right eye: Secondary | ICD-10-CM | POA: Diagnosis not present

## 2015-07-20 DIAGNOSIS — H18412 Arcus senilis, left eye: Secondary | ICD-10-CM | POA: Diagnosis not present

## 2015-07-20 DIAGNOSIS — H348322 Tributary (branch) retinal vein occlusion, left eye, stable: Secondary | ICD-10-CM | POA: Diagnosis not present

## 2015-07-20 DIAGNOSIS — H18411 Arcus senilis, right eye: Secondary | ICD-10-CM | POA: Diagnosis not present

## 2015-07-20 DIAGNOSIS — H02839 Dermatochalasis of unspecified eye, unspecified eyelid: Secondary | ICD-10-CM | POA: Diagnosis not present

## 2015-09-07 DIAGNOSIS — H348312 Tributary (branch) retinal vein occlusion, right eye, stable: Secondary | ICD-10-CM | POA: Diagnosis not present

## 2015-09-07 DIAGNOSIS — H43823 Vitreomacular adhesion, bilateral: Secondary | ICD-10-CM | POA: Diagnosis not present

## 2015-09-07 DIAGNOSIS — H35372 Puckering of macula, left eye: Secondary | ICD-10-CM | POA: Diagnosis not present

## 2015-09-07 DIAGNOSIS — H35032 Hypertensive retinopathy, left eye: Secondary | ICD-10-CM | POA: Diagnosis not present

## 2015-09-20 DIAGNOSIS — H25811 Combined forms of age-related cataract, right eye: Secondary | ICD-10-CM | POA: Diagnosis not present

## 2015-09-20 DIAGNOSIS — H2511 Age-related nuclear cataract, right eye: Secondary | ICD-10-CM | POA: Diagnosis not present

## 2015-09-21 DIAGNOSIS — H2511 Age-related nuclear cataract, right eye: Secondary | ICD-10-CM | POA: Diagnosis not present

## 2016-02-07 ENCOUNTER — Encounter: Payer: Self-pay | Admitting: Internal Medicine

## 2016-02-07 ENCOUNTER — Ambulatory Visit (INDEPENDENT_AMBULATORY_CARE_PROVIDER_SITE_OTHER): Payer: BLUE CROSS/BLUE SHIELD | Admitting: Internal Medicine

## 2016-02-07 VITALS — BP 128/72 | HR 70 | Temp 97.6°F | Resp 20 | Ht 71.5 in | Wt 183.5 lb

## 2016-02-07 DIAGNOSIS — Z23 Encounter for immunization: Secondary | ICD-10-CM

## 2016-02-07 DIAGNOSIS — M6530 Trigger finger, unspecified finger: Secondary | ICD-10-CM

## 2016-02-07 DIAGNOSIS — M65341 Trigger finger, right ring finger: Secondary | ICD-10-CM | POA: Diagnosis not present

## 2016-02-07 NOTE — Progress Notes (Signed)
   Subjective:    Patient ID: John Hendricks, male    DOB: [birth year], 60 y.o.   MRN: [id number]  HPI 60 year old patient who has enjoyed good health.  He presents with a chief complaint of difficulty with his right fourth finger.  He has very little functional limitations but has had a difficult time with full extension of the right fourth finger.  He has full flexion.  Past medical history since his last visit here, he has had cataract extraction surgery on the right.  I  Medical regimen includes aspirin only  Social history plan on a [place] vacation next week     Review of Systems  Musculoskeletal:       Decreased range of motion right fourth finger       Objective:   Physical Exam  Constitutional: He appears well-developed and well-nourished. No distress.  Musculoskeletal:  Mild trigger finger right fourth digit          Assessment & Plan:   Trigger finger right fourth finger.  Patient has very little functional limitations at this time and no pain.  Options discussed.  He wishes to defer orthopedic referral.  All his symptoms worse. Schedule annual clinical exam at his [facility]

## 2016-02-07 NOTE — Patient Instructions (Addendum)
Trigger Finger °Trigger finger (digital tendinitis and stenosing tenosynovitis) is a common disorder that causes an often painful catching of the fingers or thumb. It occurs as a clicking, snapping, or locking of a finger in the palm of the hand. This is caused by a problem with the tendons that flex or bend the fingers sliding smoothly through their sheaths. The condition may occur in any finger or a couple fingers at the same time.  °The finger may lock with the finger curled or suddenly straighten out with a snap. This is more common in patients with rheumatoid arthritis and diabetes. Left untreated, the condition may get worse to the point where the finger becomes locked in flexion, like making a fist, or less commonly locked with the finger straightened out. °CAUSES  °· Inflammation and scarring that lead to swelling around the tendon sheath. °· Repeated or forceful movements. °· Rheumatoid arthritis, an autoimmune disease that affects joints. °· Gout. °· Diabetes mellitus. °SIGNS AND SYMPTOMS °· Soreness and swelling of your finger. °· A painful clicking or snapping as you bend and straighten your finger. °DIAGNOSIS  °Your health care provider will do a physical exam of your finger to diagnose trigger finger. °TREATMENT  °· Splinting for 6-8 weeks may be helpful. °· Nonsteroidal anti-inflammatory medicines (NSAIDs) can help to relieve the pain and inflammation. °· Cortisone injections, along with splinting, may speed up recovery. Several injections may be required. Cortisone may give relief after one injection. °· Surgery is another treatment that may be used if conservative treatments do not work. Surgery can be minor, without incisions (a cut does not have to be made), and can be done with a needle through the skin. °· Other surgical choices involve an open procedure in which the surgeon opens the hand through a small incision and cuts the pulley so the tendon can again slide smoothly. Your hand will still  work fine. °HOME CARE INSTRUCTIONS °· Apply ice to the injured area, twice per day: °¨ Put ice in a plastic bag. °¨ Place a towel between your skin and the bag. °¨ Leave the ice on for 20 minutes, 3-4 times a day. °· Rest your hand often. °MAKE SURE YOU:  °· Understand these instructions. °· Will watch your condition. °· Will get help right away if you are not doing well or get worse. °  °This information is not intended to replace advice given to you by your health care provider. Make sure you discuss any questions you have with your health care provider. °  °Document Released: 01/22/2004 Document Revised: 12/04/2012 Document Reviewed: 09/03/2012 °Elsevier Interactive Patient Education ©2016 [employer] ° °

## 2016-03-14 DIAGNOSIS — L821 Other seborrheic keratosis: Secondary | ICD-10-CM | POA: Diagnosis not present

## 2016-03-14 DIAGNOSIS — Z23 Encounter for immunization: Secondary | ICD-10-CM | POA: Diagnosis not present

## 2016-03-14 DIAGNOSIS — D1801 Hemangioma of skin and subcutaneous tissue: Secondary | ICD-10-CM | POA: Diagnosis not present

## 2016-03-14 DIAGNOSIS — L814 Other melanin hyperpigmentation: Secondary | ICD-10-CM | POA: Diagnosis not present

## 2016-03-14 DIAGNOSIS — L570 Actinic keratosis: Secondary | ICD-10-CM | POA: Diagnosis not present

## 2016-04-16 ENCOUNTER — Emergency Department (HOSPITAL_COMMUNITY)
Admission: EM | Admit: 2016-04-16 | Discharge: 2016-04-16 | Disposition: A | Discharge status: Home / Self Care | Payer: BLUE CROSS/BLUE SHIELD | Attending: Emergency Medicine | Admitting: Emergency Medicine

## 2016-04-16 ENCOUNTER — Encounter (HOSPITAL_COMMUNITY): Payer: Self-pay | Admitting: Emergency Medicine

## 2016-04-16 DIAGNOSIS — N410 Acute prostatitis: Secondary | ICD-10-CM | POA: Insufficient documentation

## 2016-04-16 DIAGNOSIS — Z7982 Long term (current) use of aspirin: Secondary | ICD-10-CM | POA: Insufficient documentation

## 2016-04-16 DIAGNOSIS — R300 Dysuria: Secondary | ICD-10-CM | POA: Diagnosis not present

## 2016-04-16 LAB — URINALYSIS, ROUTINE W REFLEX MICROSCOPIC
BILIRUBIN URINE: NEGATIVE
Bacteria, UA: NONE SEEN
Glucose, UA: NEGATIVE mg/dL
Hgb urine dipstick: NEGATIVE
KETONES UR: 80 mg/dL — AB
Nitrite: NEGATIVE
Protein, ur: 30 mg/dL — AB
SQUAMOUS EPITHELIAL / LPF: NONE SEEN
Specific Gravity, Urine: 1.02 (ref 1.005–1.030)
pH: 5 (ref 5.0–8.0)

## 2016-04-16 MED ORDER — CIPROFLOXACIN HCL 500 MG PO TABS: 500.0000 mg | ORAL_TABLET | Freq: Two times a day (BID) | ORAL | 0 refills | Status: DC

## 2016-04-16 NOTE — ED Triage Notes (Signed)
Pt c/o difficulty initiating urination, urinating small amounts every 30-40 minutes, penile pain during urination, pain if pt does not urinate,constant lower abdominal pain. No n/v, fevers, chills.

## 2016-04-16 NOTE — ED Notes (Signed)
Pt given urinal and made aware of need for urine specimen 

## 2016-04-16 NOTE — ED Provider Notes (Signed)
[facility] DEPT Provider Note   CSN: [id number] Arrival date & time: 04/16/16  [id number]     History   Chief Complaint Chief Complaint  Patient presents with  . Dysuria    HPI John Hendricks is a 60 y.o. male.  Patient is a 60 year old healthy male presenting today with dysuria. He states approximately one week ago he started noticing some decrease in his urine stream and over the last 2-3 days he's had significantly more trouble starting his urine stream. He has difficulty getting started and that his stream of urine seems weaker than normal. Yesterday he started having dysuria, frequency and urgency. He denies any abdominal pain, rectal pain, testicular pain, chills or fever. No nausea or vomiting. He has no prior history of prostate issues and had a normal prostate on his physical approximately 6 months ago with his PCP. He denies any recent medication changes and has no rashes or discoloration of the skin of his penis no discharge. He is sexually active with only 1 partner for many years.   The history is provided by the patient.    History reviewed. No pertinent past medical history.  Patient Active Problem List   Diagnosis Date Noted  . 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 PLANTAR FASCIITIS, LEFT 08/01/2007    Past Surgical History:  Procedure Laterality Date  . TOOTH EXTRACTION         Home Medications    Prior to Admission medications   Medication Sig Start Date End Date Taking? Authorizing Provider  aspirin 81 MG tablet Take 81 mg by mouth daily.   Yes Historical Provider, MD  gabapentin (NEURONTIN) 300 MG capsule Take 1 capsule (300 mg total) by mouth 3 (three) times daily. Patient not taking: Reported on 04/16/2016 09/07/14   Alda Berthold, DO    Family History Family History  Problem Relation Age of Onset  . Parkinson's disease Father     Deceased  . Heart attack Mother     Mid-60s  . Dementia Mother     Living  . Breast cancer Mother   . Healthy  Sister   . Healthy Son   . Healthy Daughter   . Heart attack Maternal Grandfather   . Heart attack Maternal Grandmother     Social History Social History  Substance Use Topics  . Smoking status: Never Smoker  . Smokeless tobacco: Never Used  . Alcohol use 4.2 oz/week    7 Standard drinks or equivalent per week     Comment: 1 drink of either beer/wine/liquor     Allergies   Patient has no known allergies.   Review of Systems Review of Systems  All other systems reviewed and are negative.    Physical Exam Updated Vital Signs BP 141/96 (BP Location: Left Arm)   Pulse 104   Temp 97.5 F (36.4 C) (Oral)   Resp 16   SpO2 99%   Physical Exam  Constitutional: He is oriented to person, place, and time. He appears well-developed and well-nourished. No distress.  HENT:  Head: Normocephalic and atraumatic.  Cardiovascular: Normal rate.   Pulmonary/Chest: Effort normal.  Abdominal: Soft. He exhibits no distension. There is no tenderness. There is no guarding.  Genitourinary: Prostate is enlarged and tender.  Neurological: He is alert and oriented to person, place, and time.  Skin: Skin is warm and dry.  Nursing note and vitals reviewed.    ED Treatments / Results  Labs (all labs ordered are listed, but only abnormal results  are displayed) Labs Reviewed  URINALYSIS, ROUTINE W REFLEX MICROSCOPIC - Abnormal; Notable for the following:       Result Value   Ketones, ur 80 (*)    Protein, ur 30 (*)    Leukocytes, UA TRACE (*)    All other components within normal limits  URINE CULTURE    EKG  EKG Interpretation None       Radiology No results found.  Procedures Procedures (including critical care time)  Medications Ordered in ED Medications - No data to display   Initial Impression / Assessment and Plan / ED Course  I have reviewed the triage vital signs and the nursing notes.  Pertinent labs & imaging results that were available during my care of the  patient were reviewed by me and considered in my medical decision making (see chart for details).  Clinical Course     Patient presenting today with weakened urine stream and dysuria. It started approximately a week ago and is worsening. He denies any systemic symptoms. He denies any symptoms consistent with urinary retention. On exam he does have a boggy tender prostate with concern for new onset of prostatitis. UA is pending. Low suspicion for urethritis patient is sexually active with only 1 partner and no prior history of sexually transmitted infection. Also he is having no discharge.  UA and culture pending  UA with minimal leukocytes and WBC's.  Will treat for prostatitis with cipro Final Clinical Impressions(s) / ED Diagnoses   Final diagnoses:  Acute prostatitis    New Prescriptions New Prescriptions   CIPROFLOXACIN (CIPRO) 500 MG TABLET    Take 1 tablet (500 mg total) by mouth 2 (two) times daily.     Blanchie Dessert, MD 04/16/16 [phone]

## 2016-04-17 LAB — URINE CULTURE: Culture: <10000 — AB

## 2016-04-28 ENCOUNTER — Ambulatory Visit (INDEPENDENT_AMBULATORY_CARE_PROVIDER_SITE_OTHER): Payer: BLUE CROSS/BLUE SHIELD | Admitting: Internal Medicine

## 2016-04-28 ENCOUNTER — Encounter: Payer: Self-pay | Admitting: Internal Medicine

## 2016-04-28 VITALS — BP 102/60 | HR 75 | Temp 97.7°F | Ht 71.25 in | Wt 180.4 lb

## 2016-04-28 DIAGNOSIS — N410 Acute prostatitis: Secondary | ICD-10-CM | POA: Diagnosis not present

## 2016-04-28 MED ORDER — CIPROFLOXACIN HCL 500 MG PO TABS: 500.0000 mg | ORAL_TABLET | Freq: Two times a day (BID) | ORAL | 0 refills | Status: DC

## 2016-04-28 NOTE — Patient Instructions (Signed)
Call or return to clinic prn if these symptoms worsen or fail to improve as anticipated.

## 2016-04-28 NOTE — Progress Notes (Signed)
Subjective:    Patient ID: John Hendricks, male    DOB: May 18, 1955, 61 y.o.   MRN: [id number]  HPI  61 year old patient who is seen today for follow-up after an ED visit on December 31.  He presented with a diminished urinary flow and dysuria.  Evaluation suggested acute prostatitisand he has been on Cipro 500 mg twice daily.  His symptoms resolved in a few days and today he feels well. No prior history of [place] to [facility] or UTIs. No constitutional complaints  ED records reviewed  No past medical history on file.   Social History   Social History  . Marital status: Married    Spouse name: N/A  . Number of children: N/A  . Years of education: N/A   Occupational History  . Not on file.   Social History Main Topics  . Smoking status: Never Smoker  . Smokeless tobacco: Never Used  . Alcohol use 4.2 oz/week    7 Standard drinks or equivalent per week     Comment: 1 drink of either beer/wine/liquor  . Drug use: No  . Sexual activity: Not on file   Other Topics Concern  . Not on file   Social History Narrative   Lives with wife.   He is president of a Community education officer, Science writer, navigation system)   Highest level of education:  PhD    Past Surgical History:  Procedure Laterality Date  . TOOTH EXTRACTION      Family History  Problem Relation Age of Onset  . Parkinson's disease Father     Deceased  . Heart attack Mother     Mid-60s  . Dementia Mother     Living  . Breast cancer Mother   . Healthy Sister   . Healthy Son   . Healthy Daughter   . Heart attack Maternal Grandfather   . Heart attack Maternal Grandmother     No Known Allergies  Current Outpatient Prescriptions on File Prior to Visit  Medication Sig Dispense Refill  . aspirin 81 MG tablet Take 81 mg by mouth daily.     No current facility-administered medications on file prior to visit.     BP 102/60 (BP Location: Left Arm, Patient Position: Sitting, Cuff Size: Normal)   Pulse 75    Temp 97.7 F (36.5 C) (Oral)   Ht 5' 11.25" (1.81 m)   Wt 180 lb 6.4 oz (81.8 kg)   SpO2 99%   BMI 24.98 kg/m     Review of Systems  Constitutional: Negative for appetite change, chills, fatigue and fever.  HENT: Negative for congestion, dental problem, ear pain, hearing loss, sore throat, tinnitus, trouble swallowing and voice change.   Eyes: Negative for pain, discharge and visual disturbance.  Respiratory: Negative for cough, chest tightness, wheezing and stridor.   Cardiovascular: Negative for chest pain, palpitations and leg swelling.  Gastrointestinal: Negative for abdominal distention, abdominal pain, blood in stool, constipation, diarrhea, nausea and vomiting.  Genitourinary: Positive for decreased urine volume, difficulty urinating, dysuria and frequency. Negative for discharge, flank pain, genital sores, hematuria and urgency.  Musculoskeletal: Negative for arthralgias, back pain, gait problem, joint swelling, myalgias and neck stiffness.  Skin: Negative for rash.  Neurological: Negative for dizziness, syncope, speech difficulty, weakness, numbness and headaches.  Hematological: Negative for adenopathy. Does not bruise/bleed easily.  Psychiatric/Behavioral: Negative for behavioral problems and dysphoric mood. The patient is not nervous/anxious.        Objective:   Physical Exam  Constitutional: He appears well-nourished. No distress.  Cardiovascular: Normal rate.   Pulmonary/Chest: Breath sounds normal.  Abdominal: Soft.          Assessment & Plan:   Resolving acute prostatitis. Patient will complete 2 weeks of antibiotic therapy.  The patient was given a second prescription for additional Cipro that he will Place on hold.  He will not fill unless he has recurrent symptoms CPX 4-6 months  Nyoka Cowden

## 2016-04-28 NOTE — Progress Notes (Signed)
Pre visit review using our clinic review tool, if applicable. No additional management support is needed unless otherwise documented below in the visit note. 

## 2016-08-22 DIAGNOSIS — L821 Other seborrheic keratosis: Secondary | ICD-10-CM | POA: Diagnosis not present

## 2016-08-22 DIAGNOSIS — L570 Actinic keratosis: Secondary | ICD-10-CM | POA: Diagnosis not present

## 2016-08-29 DIAGNOSIS — H1089 Other conjunctivitis: Secondary | ICD-10-CM | POA: Diagnosis not present

## 2017-01-04 ENCOUNTER — Encounter: Payer: Self-pay | Admitting: Internal Medicine

## 2017-03-20 DIAGNOSIS — L814 Other melanin hyperpigmentation: Secondary | ICD-10-CM | POA: Diagnosis not present

## 2017-03-20 DIAGNOSIS — L570 Actinic keratosis: Secondary | ICD-10-CM | POA: Diagnosis not present

## 2017-03-20 DIAGNOSIS — L821 Other seborrheic keratosis: Secondary | ICD-10-CM | POA: Diagnosis not present

## 2017-03-20 DIAGNOSIS — D1801 Hemangioma of skin and subcutaneous tissue: Secondary | ICD-10-CM | POA: Diagnosis not present

## 2017-07-09 ENCOUNTER — Ambulatory Visit (INDEPENDENT_AMBULATORY_CARE_PROVIDER_SITE_OTHER): Payer: BLUE CROSS/BLUE SHIELD | Admitting: Internal Medicine

## 2017-07-09 ENCOUNTER — Encounter: Payer: Self-pay | Admitting: Internal Medicine

## 2017-07-09 VITALS — BP 102/70 | HR 72 | Temp 97.8°F | Ht 71.0 in | Wt 186.0 lb

## 2017-07-09 DIAGNOSIS — Z0000 Encounter for general adult medical examination without abnormal findings: Secondary | ICD-10-CM | POA: Diagnosis not present

## 2017-07-09 NOTE — Patient Instructions (Signed)

## 2017-07-09 NOTE — Progress Notes (Signed)
Subjective:    Patient ID: John Hendricks, male    DOB: [birth year], 62 y.o.   MRN: [id number]  HPI 34 -year-old patient who is seen today for a preventive health examination.  He has had a a right brachial neuritis , which has largely resolved. Last year he was treated for acute prostatitis.  No concerns or complaints  Recent dermatology and ophthalmology visit. Last colonoscopy 2009   Current Allergies: No known allergies   Past Medical History:  Reviewed history and no changes required:  mitral valve prolapse  right brachial neuritis  Past Surgical History:  status post vasectomy 1985  status post tonsillectomy 1968  Colonoscopy 2009 good right now.    Family History:  father  Died in his eighties;   history Parkinson's disease, prostate cancer at age 68  mother, age 57, breast cancer, history of MI, age 106, status post stenting x 2;  Status post aVR repair;  history of dementia    One sister in good health  Social History:  Married  Never Smoked  Ph.D. in geophysics    Two adult children, one  son is an Art gallery manager;  Daughter is a Administrator, sports at [employer]:  Tobacco use: never     Review of Systems  Constitutional: Negative for appetite change, chills, fatigue and fever.  HENT: Negative for congestion, dental problem, ear pain, hearing loss, sore throat, tinnitus, trouble swallowing and voice change.   Eyes: Negative for pain, discharge and visual disturbance.  Respiratory: Negative for cough, chest tightness, wheezing and stridor.   Cardiovascular: Negative for chest pain, palpitations and leg swelling.  Gastrointestinal: Negative for abdominal distention, abdominal pain, blood in stool, constipation, diarrhea, nausea and vomiting.  Genitourinary: Negative for difficulty urinating, discharge, flank pain, genital sores, hematuria and urgency.  Musculoskeletal: Negative for  arthralgias, back pain, gait problem, joint swelling, myalgias and neck stiffness.  Skin: Negative for rash.  Neurological: Negative for dizziness, syncope, speech difficulty, weakness, numbness and headaches.  Hematological: Negative for adenopathy. Does not bruise/bleed easily.  Psychiatric/Behavioral: Negative for behavioral problems and dysphoric mood. The patient is not nervous/anxious.        Objective:   Physical Exam  Constitutional: He appears well-developed and well-nourished.  HENT:  Head: Normocephalic and atraumatic.  Right Ear: External ear normal.  Left Ear: External ear normal.  Nose: Nose normal.  Mouth/Throat: Oropharynx is clear and moist.  Eyes: Pupils are equal, round, and reactive to light. Conjunctivae and EOM are normal. No scleral icterus.  Neck: Normal range of motion. Neck supple. No JVD present. No thyromegaly present.  Cardiovascular: Regular rhythm, normal heart sounds and intact distal pulses. Exam reveals no gallop and no friction rub.  No murmur heard. Pulmonary/Chest: Effort normal and breath sounds normal. He exhibits no tenderness.  Abdominal: Soft. Bowel sounds are normal. He exhibits no distension and no mass. There is no tenderness.  Genitourinary: Prostate normal and penis normal. Rectal exam shows guaiac negative stool.  Musculoskeletal: Normal range of motion. He exhibits no edema or tenderness.  Lymphadenopathy:    He has no cervical adenopathy.  Neurological: He is alert. He has normal reflexes. No cranial nerve deficit. Coordination normal.  Skin: Skin is warm and dry. No rash noted.  Psychiatric: He has a normal mood and affect. His behavior is normal.          Assessment & Plan:   Preventive health exam.  Will check updated lab including PSA.  10-year colonoscopy scheduled  Follow-up 1 year or as needed  Nyoka Cowden

## 2017-07-10 LAB — CBC WITH DIFFERENTIAL/PLATELET
BASOS ABS: 0 10*3/uL (ref 0.0–0.1)
Basophils Relative: 0.9 % (ref 0.0–3.0)
EOS ABS: 0.2 10*3/uL (ref 0.0–0.7)
EOS PCT: 4.4 % (ref 0.0–5.0)
HCT: 43.8 % (ref 39.0–52.0)
HEMOGLOBIN: 15.3 g/dL (ref 13.0–17.0)
Lymphocytes Relative: 29.2 % (ref 12.0–46.0)
Lymphs Abs: 1.4 10*3/uL (ref 0.7–4.0)
MCHC: 34.9 g/dL (ref 30.0–36.0)
MCV: 86.1 fl (ref 78.0–100.0)
MONO ABS: 0.4 10*3/uL (ref 0.1–1.0)
Monocytes Relative: 8.2 % (ref 3.0–12.0)
NEUTROS PCT: 57.3 % (ref 43.0–77.0)
Neutro Abs: 2.7 10*3/uL (ref 1.4–7.7)
Platelets: 235 10*3/uL (ref 150.0–400.0)
RBC: 5.09 Mil/uL (ref 4.22–5.81)
RDW: 12.6 % (ref 11.5–15.5)
WBC: 4.6 10*3/uL (ref 4.0–10.5)

## 2017-07-10 LAB — COMPREHENSIVE METABOLIC PANEL
ALK PHOS: 54 U/L (ref 39–117)
ALT: 30 U/L (ref 0–53)
AST: 20 U/L (ref 0–37)
Albumin: 4.3 g/dL (ref 3.5–5.2)
BUN: 19 mg/dL (ref 6–23)
CALCIUM: 9.3 mg/dL (ref 8.4–10.5)
CHLORIDE: 103 meq/L (ref 96–112)
CO2: 31 mEq/L (ref 19–32)
CREATININE: 0.97 mg/dL (ref 0.40–1.50)
GFR: 83.84 mL/min (ref >60.00)
Glucose, Bld: 93 mg/dL (ref 70–99)
Potassium: 4.1 mEq/L (ref 3.5–5.1)
Sodium: 141 mEq/L (ref 135–145)
TOTAL PROTEIN: 6.5 g/dL (ref 6.0–8.3)
Total Bilirubin: 0.5 mg/dL (ref 0.2–1.2)

## 2017-07-10 LAB — LIPID PANEL
Cholesterol: 226 mg/dL — ABNORMAL HIGH (ref 0–200)
HDL: 49.4 mg/dL (ref >39.00)
NonHDL: 176.66
Total CHOL/HDL Ratio: 5
Triglycerides: 219 mg/dL — ABNORMAL HIGH (ref 0.0–149.0)
VLDL: 43.8 mg/dL — AB (ref 0.0–40.0)

## 2017-07-10 LAB — HEPATITIS C ANTIBODY
Hepatitis C Ab: NONREACTIVE
SIGNAL TO CUT-OFF: 0.04 (ref <1.00)

## 2017-07-10 LAB — LDL CHOLESTEROL, DIRECT: LDL DIRECT: 152 mg/dL

## 2017-07-10 LAB — TSH: TSH: 1.28 u[IU]/mL (ref 0.35–4.50)

## 2017-07-10 LAB — PSA: PSA: 2.58 ng/mL (ref 0.10–4.00)

## 2017-08-06 DIAGNOSIS — Z23 Encounter for immunization: Secondary | ICD-10-CM | POA: Diagnosis not present

## 2017-08-09 ENCOUNTER — Encounter: Payer: Self-pay | Admitting: Internal Medicine

## 2017-10-12 ENCOUNTER — Ambulatory Visit (AMBULATORY_SURGERY_CENTER): Payer: Self-pay

## 2017-10-12 VITALS — Ht 71.0 in | Wt 183.8 lb

## 2017-10-12 DIAGNOSIS — Z1211 Encounter for screening for malignant neoplasm of colon: Secondary | ICD-10-CM

## 2017-10-12 NOTE — Progress Notes (Signed)
Per pt, no allergies to soy or egg products.Pt not taking any weight loss meds or using  O2 at home.  Pt refused emmi video. 

## 2017-10-29 ENCOUNTER — Encounter: Payer: Self-pay | Admitting: Internal Medicine

## 2017-11-05 ENCOUNTER — Encounter: Payer: Self-pay | Admitting: Internal Medicine

## 2017-11-05 ENCOUNTER — Ambulatory Visit (AMBULATORY_SURGERY_CENTER): Payer: BLUE CROSS/BLUE SHIELD | Admitting: Internal Medicine

## 2017-11-05 VITALS — BP 119/71 | HR 59 | Temp 98.4°F | Resp 16 | Ht 71.0 in | Wt 186.0 lb

## 2017-11-05 DIAGNOSIS — Z1211 Encounter for screening for malignant neoplasm of colon: Secondary | ICD-10-CM

## 2017-11-05 DIAGNOSIS — D123 Benign neoplasm of transverse colon: Secondary | ICD-10-CM | POA: Diagnosis not present

## 2017-11-05 MED ORDER — SODIUM CHLORIDE 0.9 % IV SOLN: 500.0000 mL | Freq: Once | INTRAVENOUS | Status: DC

## 2017-11-05 NOTE — Patient Instructions (Addendum)
I found and removed one small polyp that looks benign.  I will let you know pathology results and when to have another routine colonoscopy by mail and/or My Chart.  I appreciate the opportunity to care for you. Gatha Mayer, MD, [facility]  Please read handout on polyps. Continue present medications.   YOU HAD AN ENDOSCOPIC PROCEDURE TODAY AT [facility]:   Refer to the procedure report that was given to you for any specific questions about what was found during the examination.  If the procedure report does not answer your questions, please call your gastroenterologist to clarify.  If you requested that your care partner not be given the details of your procedure findings, then the procedure report has been included in a sealed envelope for you to review at your convenience later.  YOU SHOULD EXPECT: Some feelings of bloating in the abdomen. Passage of more gas than usual.  Walking can help get rid of the air that was put into your GI tract during the procedure and reduce the bloating. If you had a lower endoscopy (such as a colonoscopy or flexible sigmoidoscopy) you may notice spotting of blood in your stool or on the toilet paper. If you underwent a bowel prep for your procedure, you may not have a normal bowel movement for a few days.  Please Note:  You might notice some irritation and congestion in your nose or some drainage.  This is from the oxygen used during your procedure.  There is no need for concern and it should clear up in a day or so.  SYMPTOMS TO REPORT IMMEDIATELY:   Following lower endoscopy (colonoscopy or flexible sigmoidoscopy):  Excessive amounts of blood in the stool  Significant tenderness or worsening of abdominal pains  Swelling of the abdomen that is new, acute  Fever of 100F or higher   For urgent or emergent issues, a gastroenterologist can be reached at any hour by calling [phone].   DIET:  We do recommend a small meal at  first, but then you may proceed to your regular diet.  Drink plenty of fluids but you should avoid alcoholic beverages for 24 hours.  ACTIVITY:  You should plan to take it easy for the rest of today and you should NOT DRIVE or use heavy machinery until tomorrow (because of the sedation medicines used during the test).    FOLLOW UP: Our staff will call the number listed on your records the next business day following your procedure to check on you and address any questions or concerns that you may have regarding the information given to you following your procedure. If we do not reach you, we will leave a message.  However, if you are feeling well and you are not experiencing any problems, there is no need to return our call.  We will assume that you have returned to your regular daily activities without incident.  If any biopsies were taken you will be contacted by phone or by letter within the next 1-3 weeks.  Please call us at [phone] if you have not heard about the biopsies in 3 weeks.    SIGNATURES/CONFIDENTIALITY: You and/or your care partner have signed paperwork which will be entered into your electronic medical record.  These signatures attest to the fact that that the information above on your After Visit Summary has been reviewed and is understood.  Full responsibility of the confidentiality of this discharge information lies with you and/or your care-partner.YOU  HAD AN ENDOSCOPIC PROCEDURE TODAY AT [facility]:   Refer to the procedure report that was given to you for any specific questions about what was found during the examination.  If the procedure report does not answer your questions, please call your gastroenterologist to clarify.  If you requested that your care partner not be given the details of your procedure findings, then the procedure report has been included in a sealed envelope for you to review at your convenience later.  YOU SHOULD EXPECT: Some  feelings of bloating in the abdomen. Passage of more gas than usual.  Walking can help get rid of the air that was put into your GI tract during the procedure and reduce the bloating. If you had a lower endoscopy (such as a colonoscopy or flexible sigmoidoscopy) you may notice spotting of blood in your stool or on the toilet paper. If you underwent a bowel prep for your procedure, you may not have a normal bowel movement for a few days.  Please Note:  You might notice some irritation and congestion in your nose or some drainage.  This is from the oxygen used during your procedure.  There is no need for concern and it should clear up in a day or so.  SYMPTOMS TO REPORT IMMEDIATELY:   Following lower endoscopy (colonoscopy or flexible sigmoidoscopy):  Excessive amounts of blood in the stool  Significant tenderness or worsening of abdominal pains  Swelling of the abdomen that is new, acute  Fever of 100F or higher   For urgent or emergent issues, a gastroenterologist can be reached at any hour by calling [phone].   DIET:  We do recommend a small meal at first, but then you may proceed to your regular diet.  Drink plenty of fluids but you should avoid alcoholic beverages for 24 hours.  ACTIVITY:  You should plan to take it easy for the rest of today and you should NOT DRIVE or use heavy machinery until tomorrow (because of the sedation medicines used during the test).    FOLLOW UP: Our staff will call the number listed on your records the next business day following your procedure to check on you and address any questions or concerns that you may have regarding the information given to you following your procedure. If we do not reach you, we will leave a message.  However, if you are feeling well and you are not experiencing any problems, there is no need to return our call.  We will assume that you have returned to your regular daily activities without incident.  If any biopsies were  taken you will be contacted by phone or by letter within the next 1-3 weeks.  Please call us at [phone] if you have not heard about the biopsies in 3 weeks.    SIGNATURES/CONFIDENTIALITY: You and/or your care partner have signed paperwork which will be entered into your electronic medical record.  These signatures attest to the fact that that the information above on your After Visit Summary has been reviewed and is understood.  Full responsibility of the confidentiality of this discharge information lies with you and/or your care-partner.

## 2017-11-05 NOTE — Progress Notes (Signed)
Pt's states no medical or surgical changes since previsit or office visit. 

## 2017-11-05 NOTE — Progress Notes (Signed)
Called to room to assist during endoscopic procedure.  Patient ID and intended procedure confirmed with present staff. Received instructions for my participation in the procedure from the performing physician.  

## 2017-11-05 NOTE — Progress Notes (Signed)
Alert and oriented x 3, pleased with MAC, report to RN. Patient taken to recovery at 1027

## 2017-11-05 NOTE — Op Note (Signed)
[place] Patient Name: John Hendricks Procedure Date: 11/05/2017 9:59 AM MRN: [id number] Endoscopist: Gatha Mayer , MD Age: 62 Referring MD:  Date of Birth: [birth year] Gender: Male Account #: [id number] Procedure:                Colonoscopy Indications:              Screening for colorectal malignant neoplasm, Last                            colonoscopy: 2009 Medicines:                Propofol per Anesthesia, Monitored Anesthesia Care Procedure:                Pre-Anesthesia Assessment:                           - Prior to the procedure, a History and Physical                            was performed, and patient medications and                            allergies were reviewed. The patient's tolerance of                            previous anesthesia was also reviewed. The risks                            and benefits of the procedure and the sedation                            options and risks were discussed with the patient.                            All questions were answered, and informed consent                            was obtained. Prior Anticoagulants: The patient has                            taken no previous anticoagulant or antiplatelet                            agents. ASA Grade Assessment: I - A normal, healthy                            patient. After reviewing the risks and benefits,                            the patient was deemed in satisfactory condition to                            undergo the procedure.  After obtaining informed consent, the colonoscope                            was passed under direct vision. Throughout the                            procedure, the patient's blood pressure, pulse, and                            oxygen saturations were monitored continuously. The                            Colonoscope was introduced through the anus and                            advanced to the the cecum,  identified by                            appendiceal orifice and ileocecal valve. The                            colonoscopy was performed without difficulty. The                            patient tolerated the procedure well. The quality                            of the bowel preparation was excellent. The                            ileocecal valve, appendiceal orifice, and rectum                            were photographed. The bowel preparation used was                            Miralax. Scope In: 10:11:09 AM Scope Out: 10:28:32 AM Scope Withdrawal Time: 0 hours 14 minutes 27 seconds  Total Procedure Duration: 0 hours 17 minutes 23 seconds  Findings:                 The perianal and digital rectal examinations were                            normal. Pertinent negatives include normal prostate                            (size, shape, and consistency).                           A 8 mm polyp was found in the distal transverse                            colon. The polyp was flat. The polyp was removed  with a cold snare. Resection and retrieval were                            complete. Verification of patient identification                            for the specimen was done. Estimated blood loss was                            minimal.                           The exam was otherwise without abnormality on                            direct and retroflexion views. Complications:            No immediate complications. Estimated Blood Loss:     Estimated blood loss was minimal. Impression:               - One 8 mm polyp in the distal transverse colon,                            removed with a cold snare. Resected and retrieved.                           - The examination was otherwise normal on direct                            and retroflexion views. Recommendation:           - Patient has a contact number available for                            emergencies.  The signs and symptoms of potential                            delayed complications were discussed with the                            patient. Return to normal activities tomorrow.                            Written discharge instructions were provided to the                            patient.                           - Resume previous diet.                           - Continue present medications.                           - Repeat colonoscopy is recommended. The  colonoscopy date will be determined after pathology                            results from today's exam become available for                            review. Gatha Mayer, MD 11/05/2017 10:38:45 AM This report has been signed electronically.

## 2017-11-06 ENCOUNTER — Telehealth: Payer: Self-pay

## 2017-11-06 NOTE — Telephone Encounter (Signed)
  Follow up Call-  Call back number 11/05/2017  Post procedure Call Back phone  # [phone]  Permission to leave phone message Yes  Some recent data might be hidden     Patient questions:  Do you have a fever, pain , or abdominal swelling? No. Pain Score  0 *  Have you tolerated food without any problems? Yes.    Have you been able to return to your normal activities? Yes.    Do you have any questions about your discharge instructions: Diet   No. Medications  No. Follow up visit  No.  Do you have questions or concerns about your Care? No.  Actions: * If pain score is 4 or above: No action needed, pain <4.

## 2017-11-12 ENCOUNTER — Encounter: Payer: Self-pay | Admitting: Internal Medicine

## 2017-11-12 DIAGNOSIS — Z860100 Personal history of colon polyps, unspecified: Secondary | ICD-10-CM

## 2017-11-12 HISTORY — DX: Personal history of colonic polyps: Z86.010

## 2017-11-12 HISTORY — DX: Personal history of colon polyps, unspecified: Z86.0100

## 2017-11-12 NOTE — Progress Notes (Signed)
8 mm ssp Recall 2024 My Chart

## 2018-03-12 DIAGNOSIS — L570 Actinic keratosis: Secondary | ICD-10-CM | POA: Diagnosis not present

## 2018-03-12 DIAGNOSIS — L821 Other seborrheic keratosis: Secondary | ICD-10-CM | POA: Diagnosis not present

## 2018-03-12 DIAGNOSIS — L814 Other melanin hyperpigmentation: Secondary | ICD-10-CM | POA: Diagnosis not present

## 2018-03-12 DIAGNOSIS — L219 Seborrheic dermatitis, unspecified: Secondary | ICD-10-CM | POA: Diagnosis not present

## 2018-03-12 DIAGNOSIS — D225 Melanocytic nevi of trunk: Secondary | ICD-10-CM | POA: Diagnosis not present

## 2018-08-12 DIAGNOSIS — Z1331 Encounter for screening for depression: Secondary | ICD-10-CM | POA: Diagnosis not present

## 2018-08-12 DIAGNOSIS — Z0000 Encounter for general adult medical examination without abnormal findings: Secondary | ICD-10-CM | POA: Diagnosis not present

## 2018-08-15 DIAGNOSIS — Z0000 Encounter for general adult medical examination without abnormal findings: Secondary | ICD-10-CM | POA: Diagnosis not present

## 2018-08-15 DIAGNOSIS — Z125 Encounter for screening for malignant neoplasm of prostate: Secondary | ICD-10-CM | POA: Diagnosis not present

## 2019-01-30 DIAGNOSIS — Z23 Encounter for immunization: Secondary | ICD-10-CM | POA: Diagnosis not present

## 2019-03-25 DIAGNOSIS — L821 Other seborrheic keratosis: Secondary | ICD-10-CM | POA: Diagnosis not present

## 2019-03-25 DIAGNOSIS — C44311 Basal cell carcinoma of skin of nose: Secondary | ICD-10-CM | POA: Diagnosis not present

## 2019-03-25 DIAGNOSIS — L814 Other melanin hyperpigmentation: Secondary | ICD-10-CM | POA: Diagnosis not present

## 2019-03-25 DIAGNOSIS — D485 Neoplasm of uncertain behavior of skin: Secondary | ICD-10-CM | POA: Diagnosis not present

## 2019-03-25 DIAGNOSIS — L111 Transient acantholytic dermatosis [Grover]: Secondary | ICD-10-CM | POA: Diagnosis not present

## 2019-03-25 DIAGNOSIS — D225 Melanocytic nevi of trunk: Secondary | ICD-10-CM | POA: Diagnosis not present

## 2019-03-25 DIAGNOSIS — L570 Actinic keratosis: Secondary | ICD-10-CM | POA: Diagnosis not present

## 2019-05-09 DIAGNOSIS — C44311 Basal cell carcinoma of skin of nose: Secondary | ICD-10-CM | POA: Diagnosis not present

## 2019-05-23 ENCOUNTER — Ambulatory Visit: Payer: BC Managed Care – PPO | Attending: Internal Medicine

## 2019-05-23 DIAGNOSIS — Z20822 Contact with and (suspected) exposure to covid-19: Secondary | ICD-10-CM | POA: Diagnosis not present

## 2019-05-25 LAB — NOVEL CORONAVIRUS, NAA: SARS-CoV-2, NAA: NOT DETECTED

## 2019-09-19 DIAGNOSIS — Z0000 Encounter for general adult medical examination without abnormal findings: Secondary | ICD-10-CM | POA: Diagnosis not present

## 2019-09-19 DIAGNOSIS — R7989 Other specified abnormal findings of blood chemistry: Secondary | ICD-10-CM | POA: Diagnosis not present

## 2019-09-19 DIAGNOSIS — Z125 Encounter for screening for malignant neoplasm of prostate: Secondary | ICD-10-CM | POA: Diagnosis not present

## 2019-10-03 DIAGNOSIS — Z1331 Encounter for screening for depression: Secondary | ICD-10-CM | POA: Diagnosis not present

## 2019-10-03 DIAGNOSIS — Z0000 Encounter for general adult medical examination without abnormal findings: Secondary | ICD-10-CM | POA: Diagnosis not present

## 2019-10-03 DIAGNOSIS — M19049 Primary osteoarthritis, unspecified hand: Secondary | ICD-10-CM | POA: Diagnosis not present

## 2019-10-03 DIAGNOSIS — E7800 Pure hypercholesterolemia, unspecified: Secondary | ICD-10-CM | POA: Diagnosis not present

## 2019-10-06 ENCOUNTER — Other Ambulatory Visit: Payer: Self-pay | Admitting: Internal Medicine

## 2019-10-06 DIAGNOSIS — E7800 Pure hypercholesterolemia, unspecified: Secondary | ICD-10-CM

## 2019-10-09 DIAGNOSIS — Z1212 Encounter for screening for malignant neoplasm of rectum: Secondary | ICD-10-CM | POA: Diagnosis not present

## 2019-10-24 ENCOUNTER — Ambulatory Visit
Admission: RE | Admit: 2019-10-24 | Discharge: 2019-10-24 | Disposition: A | Discharge status: Home / Self Care | Payer: No Typology Code available for payment source | Source: Ambulatory Visit | Attending: Internal Medicine | Admitting: Internal Medicine

## 2019-10-24 DIAGNOSIS — E7800 Pure hypercholesterolemia, unspecified: Secondary | ICD-10-CM

## 2019-12-19 ENCOUNTER — Ambulatory Visit: Payer: BC Managed Care – PPO | Admitting: Internal Medicine

## 2020-01-01 ENCOUNTER — Encounter: Payer: Self-pay | Admitting: Internal Medicine

## 2020-01-01 ENCOUNTER — Other Ambulatory Visit: Payer: Self-pay

## 2020-01-01 ENCOUNTER — Ambulatory Visit (INDEPENDENT_AMBULATORY_CARE_PROVIDER_SITE_OTHER): Payer: BC Managed Care – PPO | Admitting: Internal Medicine

## 2020-01-01 VITALS — BP 110/73 | HR 59 | Ht 72.0 in | Wt 183.4 lb

## 2020-01-01 DIAGNOSIS — Z8249 Family history of ischemic heart disease and other diseases of the circulatory system: Secondary | ICD-10-CM | POA: Diagnosis not present

## 2020-01-01 DIAGNOSIS — E785 Hyperlipidemia, unspecified: Secondary | ICD-10-CM | POA: Diagnosis not present

## 2020-01-01 DIAGNOSIS — R931 Abnormal findings on diagnostic imaging of heart and coronary circulation: Secondary | ICD-10-CM | POA: Diagnosis not present

## 2020-01-01 NOTE — Patient Instructions (Signed)
Medication Instructions:  Your physician recommends that you continue on your current medications as directed. Please refer to the Current Medication list given to you today.  *If you need a refill on your cardiac medications before your next appointment, please call your pharmacy*   Lab Work: FASTING LIPID PANEL to check cholesterol   If you have labs (blood work) drawn today and your tests are completely normal, you will receive your results only by: Marland Kitchen MyChart Message (if you have MyChart) OR . A paper copy in the mail If you have any lab test that is abnormal or we need to change your treatment, we will call you to review the results.   Testing/Procedures: NONE   Follow-Up: At [facility], you and your health needs are our priority.  As part of our continuing mission to provide you with exceptional heart care, we have created designated Provider Care Teams.  These Care Teams include your primary Cardiologist (physician) and Advanced Practice Providers (APPs -  Physician Assistants and Nurse Practitioners) who all work together to provide you with the care you need, when you need it.  We recommend signing up for the patient portal called "MyChart".  Sign up information is provided on this After Visit Summary.  MyChart is used to connect with patients for Virtual Visits (Telemedicine).  Patients are able to view lab/test results, encounter notes, upcoming appointments, etc.  Non-urgent messages can be sent to your provider as well.   To learn more about what you can do with MyChart, go to [email or web address].    Your next appointment:   3 month(s) - lipid clinic  The format for your next appointment:   In Person or Virtual   Provider:   K. Mali Hilty, MD   Other Instructions

## 2020-01-01 NOTE — Progress Notes (Signed)
LIPID CLINIC CONSULT NOTE  Chief Complaint:  Dyslipidemia, high CAC score  Primary Care Physician: Tisovec, Fransico Him, MD  Primary Cardiologist:  No primary care provider on file.  HPI:  John Hendricks is a 64 y.o. male who is being seen today for the evaluation of dyslipidemia at the request of Tisovec, Fransico Him, MD.  This is a pleasant 64 year old male kindly referred by Dr. Osborne Casco for evaluation management of dyslipidemia and high CAC score.  Mr. Adorno is self-employed at a [employer] and works from home and is fairly sedentary job.  Past medical history is not that significant.  More recently established care at [facility].  During his physical was noted to have elevated cholesterol.  His total cholesterol was 264, triglycerides 222, HDL 54 and LDL 166.  Non-HDL cholesterol 210.  Subsequently was started on rosuvastatin 10 mg daily which is tolerated for the past several months as well as low-dose aspirin 81 mg daily.  Family history significant for MI in his mother possibly in her 32s or 50s he received several stents and ultimately had one of the first TAVR procedures.  She is now deceased.  His grandparents both had coronary artery disease.  Despite this he reports being asymptomatic.  He said this past weekend he ripped out some foundation bushes and was able to do digging and working with a hatchet for about 45 minutes before he became fatigued.  That being said, he had recent coronary artery calcium scoring which was significantly abnormal indicating a total calcium score of 733, 92nd percentile for age and sex matched control.  He was noted to have multivessel CAD.  Additionally had some granulomatous nodules in his lungs.  I discussed the significance of his calcium score today suggesting there is early onset heart disease.  We also discussed the ischemia trial which he actually has previously read and agreed that maximal medical therapy is likely the best first  route in this situation.  In fact no longer recommend stress testing in these patients despite high calcium scores if they are completely asymptomatic.  PMHx:  Past Medical History:  Diagnosis Date  . Arthritis    hands  . Hx of colonic polyp 11/12/2017    Past Surgical History:  Procedure Laterality Date  . COLONOSCOPY    . TOOTH EXTRACTION      FAMHx:  Family History  Problem Relation Age of Onset  . Parkinson's disease Father        Deceased  . Heart attack Mother        Mid-60s  . Dementia Mother        Living  . Breast cancer Mother   . Heart disease Mother   . Healthy Sister   . Healthy Son   . Healthy Daughter   . Heart attack Maternal Grandfather   . Heart attack Maternal Grandmother     SOCHx:   reports that he has never smoked. He has never used smokeless tobacco. He reports current alcohol use of about 7.0 standard drinks of alcohol per week. He reports that he does not use drugs.  ALLERGIES:  No Known Allergies  ROS: Pertinent items noted in HPI and remainder of comprehensive ROS otherwise negative.  HOME MEDS: Current Outpatient Medications on File Prior to Visit  Medication Sig Dispense Refill  . aspirin (ASPIR-LOW) 81 MG EC tablet     . rosuvastatin (CRESTOR) 10 MG tablet Take 10 mg by mouth daily.  Current Facility-Administered Medications on File Prior to Visit  Medication Dose Route Frequency Provider Last Rate Last Admin  . 0.9 %  sodium chloride infusion  500 mL Intravenous Once Gatha Mayer, MD        LABS/IMAGING: No results found for this or any previous visit (from the past 48 hour(s)). No results found.  LIPID PANEL:    Component Value Date/Time   CHOL 226 (H) 07/09/2017 1528   TRIG 219.0 (H) 07/09/2017 1528   HDL 49.40 07/09/2017 1528   CHOLHDL 5 07/09/2017 1528   VLDL 43.8 (H) 07/09/2017 1528   LDLDIRECT 152.0 07/09/2017 1528    WEIGHTS: Wt Readings from Last 3 Encounters:  01/01/20 183 lb 6.4 oz (83.2 kg)   11/05/17 186 lb (84.4 kg)  10/12/17 183 lb 12.8 oz (83.4 kg)    VITALS: BP 110/73   Pulse (!) 59   Ht 6' (1.829 m)   Wt 183 lb 6.4 oz (83.2 kg)   SpO2 100%   BMI 24.87 kg/m   EXAM: General appearance: alert and no distress Neck: no carotid bruit, no JVD and thyroid not enlarged, symmetric, no tenderness/mass/nodules Lungs: clear to auscultation bilaterally Heart: regular rate and rhythm, S1, S2 normal, no murmur, click, rub or gallop Abdomen: soft, non-tender; bowel sounds normal; no masses,  no organomegaly Extremities: extremities normal, atraumatic, no cyanosis or edema Pulses: 2+ and symmetric Skin: Skin color, texture, turgor normal. No rashes or lesions Neurologic: Grossly normal Psych: Pleasant  EKG: Deferred  ASSESSMENT: 1. Mixed dyslipidemia, goal LDL less than 70 2. Multivessel coronary artery disease with CAC score of 733, the 2nd percentile for age and sex matched control 3. Family history of premature coronary disease in his mother and grandparents 13. Sedentary lifestyle  PLAN: 1.   Mr. Yankee has a mixed dyslipidemia and should be on high potency statin.  He was recently started on rosuvastatin about 3 months ago.  I like to recheck his lipid profile and will most likely increase him to high potency 20 or 40 mg per guidelines and based on his on treatment LDL.  His high calcium score is concerning and I encouraged him to continue to be active however I do not see a role for additional testing as he is asymptomatic.  He does need to increase his exercise.  His diet seems to be fairly good and weight is well-maintained.  Plan follow-up with me in about 3 months.  Thanks again for the kind referral.  Pixie Casino, MD, FACC, [place] Director of the Advanced Lipid Disorders &  Cardiovascular Risk Reduction Clinic Diplomate of the American Board of Clinical Lipidology Attending Cardiologist  Direct Dial: [phone]   Fax: [phone]  Website:  [email or web address] 01/01/2020, 4:32 PM

## 2020-01-02 DIAGNOSIS — E785 Hyperlipidemia, unspecified: Secondary | ICD-10-CM | POA: Diagnosis not present

## 2020-01-02 LAB — LIPID PANEL
Chol/HDL Ratio: 3 ratio (ref 0.0–5.0)
Cholesterol, Total: 160 mg/dL (ref 100–199)
HDL: 54 mg/dL (ref >39)
LDL Chol Calc (NIH): 86 mg/dL (ref 0–99)
Triglycerides: 110 mg/dL (ref 0–149)
VLDL Cholesterol Cal: 20 mg/dL (ref 5–40)

## 2020-01-28 DIAGNOSIS — Z23 Encounter for immunization: Secondary | ICD-10-CM | POA: Diagnosis not present

## 2020-03-18 ENCOUNTER — Telehealth: Payer: Self-pay | Admitting: Internal Medicine

## 2020-03-18 DIAGNOSIS — E785 Hyperlipidemia, unspecified: Secondary | ICD-10-CM

## 2020-03-18 NOTE — Telephone Encounter (Signed)
MyChart message sent to patient with reminder to have fasting lipid panel prior to 03/26/2020 visit

## 2020-03-25 DIAGNOSIS — E785 Hyperlipidemia, unspecified: Secondary | ICD-10-CM | POA: Diagnosis not present

## 2020-03-26 ENCOUNTER — Ambulatory Visit (INDEPENDENT_AMBULATORY_CARE_PROVIDER_SITE_OTHER): Payer: BC Managed Care – PPO | Admitting: Internal Medicine

## 2020-03-26 ENCOUNTER — Other Ambulatory Visit: Payer: Self-pay

## 2020-03-26 ENCOUNTER — Encounter: Payer: Self-pay | Admitting: Internal Medicine

## 2020-03-26 VITALS — BP 124/74 | HR 68 | Ht 72.0 in | Wt 183.4 lb

## 2020-03-26 DIAGNOSIS — Z8249 Family history of ischemic heart disease and other diseases of the circulatory system: Secondary | ICD-10-CM | POA: Diagnosis not present

## 2020-03-26 DIAGNOSIS — R931 Abnormal findings on diagnostic imaging of heart and coronary circulation: Secondary | ICD-10-CM

## 2020-03-26 DIAGNOSIS — E785 Hyperlipidemia, unspecified: Secondary | ICD-10-CM

## 2020-03-26 LAB — LIPID PANEL
Chol/HDL Ratio: 3.4 ratio (ref 0.0–5.0)
Cholesterol, Total: 169 mg/dL (ref 100–199)
HDL: 49 mg/dL (ref >39)
LDL Chol Calc (NIH): 96 mg/dL (ref 0–99)
Triglycerides: 135 mg/dL (ref 0–149)
VLDL Cholesterol Cal: 24 mg/dL (ref 5–40)

## 2020-03-26 MED ORDER — ROSUVASTATIN CALCIUM 40 MG PO TABS: 40.0000 mg | ORAL_TABLET | Freq: Every day | ORAL | 3 refills | Status: DC

## 2020-03-26 NOTE — Progress Notes (Signed)
LIPID CLINIC CONSULT NOTE  Chief Complaint:  Dyslipidemia, high CAC score  Primary Care Physician: Tisovec, Fransico Him, MD  Primary Cardiologist:  No primary care provider on file.  HPI:  John Hendricks is a 64 y.o. male who is being seen today for the evaluation of dyslipidemia at the request of No ref. provider found.  This is a pleasant 64 year old male kindly referred by Dr. Osborne Casco for evaluation management of dyslipidemia and high CAC score.  John Hendricks is self-employed at a [employer] and works from home and is fairly sedentary job.  Past medical history is not that significant.  More recently established care at [facility].  During his physical was noted to have elevated cholesterol.  His total cholesterol was 264, triglycerides 222, HDL 54 and LDL 166.  Non-HDL cholesterol 210.  Subsequently was started on rosuvastatin 10 mg daily which is tolerated for the past several months as well as low-dose aspirin 81 mg daily.  Family history significant for MI in his mother possibly in her 2s or 52s he received several stents and ultimately had one of the first TAVR procedures.  She is now deceased.  His grandparents both had coronary artery disease.  Despite this he reports being asymptomatic.  He said this past weekend he ripped out some foundation bushes and was able to do digging and working with a hatchet for about 45 minutes before he became fatigued.  That being said, he had recent coronary artery calcium scoring which was significantly abnormal indicating a total calcium score of 733, 92nd percentile for age and sex matched control.  He was noted to have multivessel CAD.  Additionally had some granulomatous nodules in his lungs.  I discussed the significance of his calcium score today suggesting there is early onset heart disease.  We also discussed the ischemia trial which he actually has previously read and agreed that maximal medical therapy is likely the best first  route in this situation.  In fact no longer recommend stress testing in these patients despite high calcium scores if they are completely asymptomatic.  03/26/2020  John Hendricks returns today for follow-up.  He had repeat lipids drawn which show an increase in LDL cholesterol compared to levels about 2 months ago.  His total is 169 up from 160 and LDL of 96 up from 86.  He remains on 10 mg rosuvastatin.  As mentioned above his calcium score was quite high with the 2nd percentile for age and sex matched control.  He remains asymptomatic.  It is difficult to know what the changes in his lipids other than possibly less physical activity.  Diet is been fairly stable  PMHx:  Past Medical History:  Diagnosis Date  . Arthritis    hands  . Hx of colonic polyp 11/12/2017    Past Surgical History:  Procedure Laterality Date  . COLONOSCOPY    . TOOTH EXTRACTION      FAMHx:  Family History  Problem Relation Age of Onset  . Parkinson's disease Father        Deceased  . Heart attack Mother        Mid-60s  . Dementia Mother        Living  . Breast cancer Mother   . Heart disease Mother   . Healthy Sister   . Healthy Son   . Healthy Daughter   . Heart attack Maternal Grandfather   . Heart attack Maternal Grandmother     SOCHx:  reports that he has never smoked. He has never used smokeless tobacco. He reports current alcohol use of about 7.0 standard drinks of alcohol per week. He reports that he does not use drugs.  ALLERGIES:  No Known Allergies  ROS: Pertinent items noted in HPI and remainder of comprehensive ROS otherwise negative.  HOME MEDS: Current Outpatient Medications on File Prior to Visit  Medication Sig Dispense Refill  . aspirin 81 MG EC tablet     . rosuvastatin (CRESTOR) 10 MG tablet Take 10 mg by mouth daily.     Current Facility-Administered Medications on File Prior to Visit  Medication Dose Route Frequency Provider Last Rate Last Admin  . 0.9 %  sodium chloride  infusion  500 mL Intravenous Once Gatha Mayer, MD        LABS/IMAGING: Results for orders placed or performed in visit on 03/18/20 (from the past 48 hour(s))  Lipid panel     Status: None   Collection Time: 03/25/20  9:09 AM  Result Value Ref Range   Cholesterol, Total 169 100 - 199 mg/dL   Triglycerides 135 0 - 149 mg/dL   HDL 49 >39 mg/dL   VLDL Cholesterol Cal 24 5 - 40 mg/dL   LDL Chol Calc (NIH) 96 0 - 99 mg/dL   Chol/HDL Ratio 3.4 0.0 - 5.0 ratio    Comment:                                   T. Chol/HDL Ratio                                             Men  Women                               1/2 Avg.Risk  3.4    3.3                                   Avg.Risk  5.0    4.4                                2X Avg.Risk  9.6    7.1                                3X Avg.Risk 23.4   11.0    No results found.  LIPID PANEL:    Component Value Date/Time   CHOL 169 03/25/2020 0909   TRIG 135 03/25/2020 0909   HDL 49 03/25/2020 0909   CHOLHDL 3.4 03/25/2020 0909   CHOLHDL 5 07/09/2017 1528   VLDL 43.8 (H) 07/09/2017 1528   LDLCALC 96 03/25/2020 0909   LDLDIRECT 152.0 07/09/2017 1528    WEIGHTS: Wt Readings from Last 3 Encounters:  03/26/20 183 lb 6.4 oz (83.2 kg)  01/01/20 183 lb 6.4 oz (83.2 kg)  11/05/17 186 lb (84.4 kg)    VITALS: BP 124/74   Pulse 68   Ht 6' (1.829 m)   Wt 183 lb 6.4 oz (83.2 kg)   BMI 24.87 kg/m  EXAM: Deferred  EKG: Deferred  ASSESSMENT: 1. Mixed dyslipidemia, goal LDL less than 70 2. Multivessel coronary artery disease with CAC score of 733, the 2nd percentile for age and sex matched control 3. Family history of premature coronary disease in his mother and grandparents 61. Sedentary lifestyle  PLAN: 1.   John Hendricks remains above goal LDL less than 70 with recent mild increase in cholesterol possibly due to decreased activity.  He should continue with diet and increase activity however I think it is unlikely he will reach target on  his current dose of rosuvastatin.  Would recommend an increase up to 40 mg daily which should help him reach a target.  We will plan repeat lipids in about 3 to 4 months and follow-up afterwards.  He is advised to contact us if he has any side effects with the statin which we went over today, primarily consisting of myalgias.  Pixie Casino, MD, [facility], [place] Director of the Advanced Lipid Disorders &  Cardiovascular Risk Reduction Clinic Diplomate of the American Board of Clinical Lipidology Attending Cardiologist  Direct Dial: [phone]  Fax: [phone]  Website:  [email or web address] 03/26/2020, 9:48 AM

## 2020-03-26 NOTE — Patient Instructions (Signed)
Medication Instructions:  Increase Rosuvastatin (crestor) to 40mg  once daily. *If you need a refill on your cardiac medications before your next appointment, please call your pharmacy*   Lab Work: Your physician recommends that you return for lab work in: 4 months: lipid profile   If you have labs (blood work) drawn today and your tests are completely normal, you will receive your results only by: Marland Kitchen MyChart Message (if you have MyChart) OR . A paper copy in the mail If you have any lab test that is abnormal or we need to change your treatment, we will call you to review the results.   Follow-Up: At [facility], you and your health needs are our priority.  As part of our continuing mission to provide you with exceptional heart care, we have created designated Provider Care Teams.  These Care Teams include your primary Cardiologist (physician) and Advanced Practice Providers (APPs -  Physician Assistants and Nurse Practitioners) who all work together to provide you with the care you need, when you need it.  We recommend signing up for the patient portal called "MyChart".  Sign up information is provided on this After Visit Summary.  MyChart is used to connect with patients for Virtual Visits (Telemedicine).  Patients are able to view lab/test results, encounter notes, upcoming appointments, etc.  Non-urgent messages can be sent to your provider as well.   To learn more about what you can do with MyChart, go to [email or web address].    Your next appointment:   4 month(s)  The format for your next appointment:   In Person  Provider:   K. Mali Hilty, MD   Other Instructions In lipid clinic.

## 2020-03-31 DIAGNOSIS — L814 Other melanin hyperpigmentation: Secondary | ICD-10-CM | POA: Diagnosis not present

## 2020-03-31 DIAGNOSIS — L821 Other seborrheic keratosis: Secondary | ICD-10-CM | POA: Diagnosis not present

## 2020-03-31 DIAGNOSIS — Z85828 Personal history of other malignant neoplasm of skin: Secondary | ICD-10-CM | POA: Diagnosis not present

## 2020-03-31 DIAGNOSIS — D225 Melanocytic nevi of trunk: Secondary | ICD-10-CM | POA: Diagnosis not present

## 2020-03-31 DIAGNOSIS — L820 Inflamed seborrheic keratosis: Secondary | ICD-10-CM | POA: Diagnosis not present

## 2020-07-15 DIAGNOSIS — E785 Hyperlipidemia, unspecified: Secondary | ICD-10-CM | POA: Diagnosis not present

## 2020-07-15 LAB — LIPID PANEL
Chol/HDL Ratio: 2.9 ratio (ref 0.0–5.0)
Cholesterol, Total: 156 mg/dL (ref 100–199)
HDL: 53 mg/dL (ref >39)
LDL Chol Calc (NIH): 86 mg/dL (ref 0–99)
Triglycerides: 92 mg/dL (ref 0–149)
VLDL Cholesterol Cal: 17 mg/dL (ref 5–40)

## 2020-07-16 ENCOUNTER — Ambulatory Visit (INDEPENDENT_AMBULATORY_CARE_PROVIDER_SITE_OTHER): Payer: BC Managed Care – PPO | Admitting: Internal Medicine

## 2020-07-16 ENCOUNTER — Encounter: Payer: Self-pay | Admitting: Internal Medicine

## 2020-07-16 ENCOUNTER — Other Ambulatory Visit: Payer: Self-pay

## 2020-07-16 VITALS — BP 125/72 | HR 77 | Ht 72.0 in | Wt 186.2 lb

## 2020-07-16 DIAGNOSIS — R931 Abnormal findings on diagnostic imaging of heart and coronary circulation: Secondary | ICD-10-CM

## 2020-07-16 DIAGNOSIS — Z8249 Family history of ischemic heart disease and other diseases of the circulatory system: Secondary | ICD-10-CM | POA: Diagnosis not present

## 2020-07-16 DIAGNOSIS — E785 Hyperlipidemia, unspecified: Secondary | ICD-10-CM | POA: Diagnosis not present

## 2020-07-16 MED ORDER — EZETIMIBE 10 MG PO TABS: 10.0000 mg | ORAL_TABLET | Freq: Every day | ORAL | 3 refills | Status: DC

## 2020-07-16 NOTE — Patient Instructions (Signed)
Medication Instructions:  START zetia 10mg  daily  CONTINUE all other current medications  *If you need a refill on your cardiac medications before your next appointment, please call your pharmacy*   Lab Work: LIPID PANEL with PCP -- please have results faxed to [phone]  If you have labs (blood work) drawn today and your tests are completely normal, you will receive your results only by: Marland Kitchen MyChart Message (if you have MyChart) OR . A paper copy in the mail If you have any lab test that is abnormal or we need to change your treatment, we will call you to review the results.   Testing/Procedures: NONE   Follow-Up: At [facility], you and your health needs are our priority.  As part of our continuing mission to provide you with exceptional heart care, we have created designated Provider Care Teams.  These Care Teams include your primary Cardiologist (physician) and Advanced Practice Providers (APPs -  Physician Assistants and Nurse Practitioners) who all work together to provide you with the care you need, when you need it.  We recommend signing up for the patient portal called "MyChart".  Sign up information is provided on this After Visit Summary.  MyChart is used to connect with patients for Virtual Visits (Telemedicine).  Patients are able to view lab/test results, encounter notes, upcoming appointments, etc.  Non-urgent messages can be sent to your provider as well.   To learn more about what you can do with MyChart, go to [email or web address].    Your next appointment:   4 month(s) - lipid clinic  The format for your next appointment:   In Person  Provider:   K. Mali Hilty, MD   Other Instructions

## 2020-07-16 NOTE — Progress Notes (Signed)
LIPID CLINIC CONSULT NOTE  Chief Complaint:  Follow-up dyslipidemia, high CAC score  Primary Care Physician: Tisovec, Fransico Him, MD  Primary Cardiologist:  No primary care provider on file.  HPI:  John Hendricks is a 65 y.o. male who is being seen today for the evaluation of dyslipidemia at the request of Tisovec, Fransico Him, MD.  This is a pleasant 65 year old male kindly referred by Dr. Osborne Casco for evaluation management of dyslipidemia and high CAC score.  John Hendricks is self-employed at a [employer] and works from home and is fairly sedentary job.  Past medical history is not that significant.  More recently established care at [facility].  During his physical was noted to have elevated cholesterol.  His total cholesterol was 264, triglycerides 222, HDL 54 and LDL 166.  Non-HDL cholesterol 210.  Subsequently was started on rosuvastatin 10 mg daily which is tolerated for the past several months as well as low-dose aspirin 81 mg daily.  Family history significant for MI in his mother possibly in her 81s or 24s he received several stents and ultimately had one of the first TAVR procedures.  She is now deceased.  His grandparents both had coronary artery disease.  Despite this he reports being asymptomatic.  He said this past weekend he ripped out some foundation bushes and was able to do digging and working with a hatchet for about 45 minutes before he became fatigued.  That being said, he had recent coronary artery calcium scoring which was significantly abnormal indicating a total calcium score of 733, 92nd percentile for age and sex matched control.  He was noted to have multivessel CAD.  Additionally had some granulomatous nodules in his lungs.  I discussed the significance of his calcium score today suggesting there is early onset heart disease.  We also discussed the ischemia trial which he actually has previously read and agreed that maximal medical therapy is likely the best  first route in this situation.  In fact no longer recommend stress testing in these patients despite high calcium scores if they are completely asymptomatic.  03/26/2020  John Hendricks returns today for follow-up.  He had repeat lipids drawn which show an increase in LDL cholesterol compared to levels about 2 months ago.  His total is 169 up from 160 and LDL of 96 up from 86.  He remains on 10 mg rosuvastatin.  As mentioned above his calcium score was quite high with the 2nd percentile for age and sex matched control.  He remains asymptomatic.  It is difficult to know what the changes in his lipids other than possibly less physical activity.  Diet is been fairly stable  07/16/2020  John Hendricks is seen today in follow-up.  Overall he says he is doing well.  He has an appointment with Dr. Osborne Casco this summer.  We are working on dietary changes to help with additional lipid lowering and I increased his rosuvastatin to 40 mg daily to try to target LDL less than 70, given extensive coronary calcification.  He is recent lipids showed total cholesterol 156, triglycerides 92, HDL 53 and LDL 86, still somewhat above target.  This is despite optimizing his diet and being fairly close to a normal weight.  We discussed options at this point.  PMHx:  Past Medical History:  Diagnosis Date  . Arthritis    hands  . Hx of colonic polyp 11/12/2017    Past Surgical History:  Procedure Laterality Date  .  COLONOSCOPY    . TOOTH EXTRACTION      FAMHx:  Family History  Problem Relation Age of Onset  . Parkinson's disease Father        Deceased  . Heart attack Mother        Mid-60s  . Dementia Mother        Living  . Breast cancer Mother   . Heart disease Mother   . Healthy Sister   . Healthy Son   . Healthy Daughter   . Heart attack Maternal Grandfather   . Heart attack Maternal Grandmother     SOCHx:   reports that he has never smoked. He has never used smokeless tobacco. He reports current alcohol use  of about 7.0 standard drinks of alcohol per week. He reports that he does not use drugs.  ALLERGIES:  No Known Allergies  ROS: Pertinent items noted in HPI and remainder of comprehensive ROS otherwise negative.  HOME MEDS: Current Outpatient Medications on File Prior to Visit  Medication Sig Dispense Refill  . aspirin 81 MG EC tablet     . rosuvastatin (CRESTOR) 40 MG tablet Take 1 tablet (40 mg total) by mouth daily. 90 tablet 3   Current Facility-Administered Medications on File Prior to Visit  Medication Dose Route Frequency Provider Last Rate Last Admin  . 0.9 %  sodium chloride infusion  500 mL Intravenous Once Gatha Mayer, MD        LABS/IMAGING: Results for orders placed or performed in visit on 03/26/20 (from the past 48 hour(s))  Lipid panel     Status: None   Collection Time: 07/15/20  9:26 AM  Result Value Ref Range   Cholesterol, Total 156 100 - 199 mg/dL   Triglycerides 92 0 - 149 mg/dL   HDL 53 >39 mg/dL   VLDL Cholesterol Cal 17 5 - 40 mg/dL   LDL Chol Calc (NIH) 86 0 - 99 mg/dL   Chol/HDL Ratio 2.9 0.0 - 5.0 ratio    Comment:                                   T. Chol/HDL Ratio                                             Men  Women                               1/2 Avg.Risk  3.4    3.3                                   Avg.Risk  5.0    4.4                                2X Avg.Risk  9.6    7.1                                3X Avg.Risk 23.4   11.0    No results found.  LIPID PANEL:    Component Value Date/Time  CHOL 156 07/15/2020 0926   TRIG 92 07/15/2020 0926   HDL 53 07/15/2020 0926   CHOLHDL 2.9 07/15/2020 0926   CHOLHDL 5 07/09/2017 1528   VLDL 43.8 (H) 07/09/2017 1528   LDLCALC 86 07/15/2020 0926   LDLDIRECT 152.0 07/09/2017 1528    WEIGHTS: Wt Readings from Last 3 Encounters:  07/16/20 186 lb 3.2 oz (84.5 kg)  03/26/20 183 lb 6.4 oz (83.2 kg)  01/01/20 183 lb 6.4 oz (83.2 kg)    VITALS: BP 125/72   Pulse 77   Ht 6' (1.829 m)    Wt 186 lb 3.2 oz (84.5 kg)   SpO2 99%   BMI 25.25 kg/m   EXAM: Deferred  EKG: Deferred  ASSESSMENT: 1. Mixed dyslipidemia, goal LDL less than 70 2. Multivessel coronary artery disease with CAC score of 733, the 2nd percentile for age and sex matched control 3. Family history of premature coronary disease in his mother and grandparents 13. Sedentary lifestyle  PLAN: 1.   John Hendricks continues to be above target LDL less than 70 despite an increase in his rosuvastatin.  He is also try to optimize the diet more.  Weight has been fairly stable if not up a few pounds.  He is getting continue to work on this but I really think that his severe coronary calcification warrants more aggressive therapy.  Would recommend adding ezetimibe 10 mg daily to his current regimen which should help target his LDL to less than 70.  He is in agreement with this.  We will repeat lipids in about 3 months which could be obtained at his PCPs office because he has a follow-up appointment he said the summer.  Follow-up with me around that time  Pixie Casino, MD, FACC, [place] Director of the Advanced Lipid Disorders &  Cardiovascular Risk Reduction Clinic Diplomate of the American Board of Clinical Lipidology Attending Cardiologist  Direct Dial: [phone]  Fax: [phone]  Website:  [email or web address] 07/16/2020, 9:39 AM

## 2020-10-08 DIAGNOSIS — Z125 Encounter for screening for malignant neoplasm of prostate: Secondary | ICD-10-CM | POA: Diagnosis not present

## 2020-10-08 DIAGNOSIS — E7800 Pure hypercholesterolemia, unspecified: Secondary | ICD-10-CM | POA: Diagnosis not present

## 2020-10-15 DIAGNOSIS — Z1339 Encounter for screening examination for other mental health and behavioral disorders: Secondary | ICD-10-CM | POA: Diagnosis not present

## 2020-10-15 DIAGNOSIS — Z1212 Encounter for screening for malignant neoplasm of rectum: Secondary | ICD-10-CM | POA: Diagnosis not present

## 2020-10-15 DIAGNOSIS — Z1331 Encounter for screening for depression: Secondary | ICD-10-CM | POA: Diagnosis not present

## 2020-10-15 DIAGNOSIS — G545 Neuralgic amyotrophy: Secondary | ICD-10-CM | POA: Diagnosis not present

## 2020-10-15 DIAGNOSIS — Z0000 Encounter for general adult medical examination without abnormal findings: Secondary | ICD-10-CM | POA: Diagnosis not present

## 2020-11-30 ENCOUNTER — Encounter: Payer: Self-pay | Admitting: Internal Medicine

## 2020-11-30 ENCOUNTER — Other Ambulatory Visit: Payer: Self-pay

## 2020-11-30 ENCOUNTER — Ambulatory Visit (INDEPENDENT_AMBULATORY_CARE_PROVIDER_SITE_OTHER): Payer: BC Managed Care – PPO | Admitting: Internal Medicine

## 2020-11-30 VITALS — BP 124/70 | HR 80 | Ht 72.0 in | Wt 186.6 lb

## 2020-11-30 DIAGNOSIS — E785 Hyperlipidemia, unspecified: Secondary | ICD-10-CM

## 2020-11-30 DIAGNOSIS — R931 Abnormal findings on diagnostic imaging of heart and coronary circulation: Secondary | ICD-10-CM | POA: Diagnosis not present

## 2020-11-30 DIAGNOSIS — Z8249 Family history of ischemic heart disease and other diseases of the circulatory system: Secondary | ICD-10-CM | POA: Diagnosis not present

## 2020-11-30 MED ORDER — EZETIMIBE 10 MG PO TABS: 10.0000 mg | ORAL_TABLET | Freq: Every day | ORAL | 3 refills | Status: DC

## 2020-11-30 NOTE — Patient Instructions (Signed)
  Follow-Up: At [facility], you and your health needs are our priority.  As part of our continuing mission to provide you with exceptional heart care, we have created designated Provider Care Teams.  These Care Teams include your primary Cardiologist (physician) and Advanced Practice Providers (APPs -  Physician Assistants and Nurse Practitioners) who all work together to provide you with the care you need, when you need it.  We recommend signing up for the patient portal called "MyChart".  Sign up information is provided on this After Visit Summary.  MyChart is used to connect with patients for Virtual Visits (Telemedicine).  Patients are able to view lab/test results, encounter notes, upcoming appointments, etc.  Non-urgent messages can be sent to your provider as well.   To learn more about what you can do with MyChart, go to [email or web address].    Your next appointment:   12 month(s)  The format for your next appointment:   In Person  Provider:   K. Mali Hilty, MD

## 2020-11-30 NOTE — Progress Notes (Signed)
LIPID CLINIC CONSULT NOTE  Chief Complaint:  Follow-up dyslipidemia, high CAC score  Primary Care Physician: Tisovec, Fransico Him, MD  Primary Cardiologist:  None  HPI:  John Hendricks is a 65 y.o. male who is being seen today for the evaluation of dyslipidemia at the request of Tisovec, Fransico Him, MD.  This is a pleasant 65 year old male kindly referred by Dr. Osborne Casco for evaluation management of dyslipidemia and high CAC score.  John Hendricks is self-employed at a [employer] and works from home and is fairly sedentary job.  Past medical history is not that significant.  More recently established care at [facility].  During his physical was noted to have elevated cholesterol.  His total cholesterol was 264, triglycerides 222, HDL 54 and LDL 166.  Non-HDL cholesterol 210.  Subsequently was started on rosuvastatin 10 mg daily which is tolerated for the past several months as well as low-dose aspirin 81 mg daily.  Family history significant for MI in his mother possibly in her 61s or 72s he received several stents and ultimately had one of the first TAVR procedures.  She is now deceased.  His grandparents both had coronary artery disease.  Despite this he reports being asymptomatic.  He said this past weekend he ripped out some foundation bushes and was able to do digging and working with a hatchet for about 45 minutes before he became fatigued.  That being said, he had recent coronary artery calcium scoring which was significantly abnormal indicating a total calcium score of 733, 92nd percentile for age and sex matched control.  He was noted to have multivessel CAD.  Additionally had some granulomatous nodules in his lungs.  I discussed the significance of his calcium score today suggesting there is early onset heart disease.  We also discussed the ischemia trial which he actually has previously read and agreed that maximal medical therapy is likely the best first route in this  situation.  In fact no longer recommend stress testing in these patients despite high calcium scores if they are completely asymptomatic.  03/26/2020  John Hendricks returns today for follow-up.  He had repeat lipids drawn which show an increase in LDL cholesterol compared to levels about 2 months ago.  His total is 169 up from 160 and LDL of 96 up from 86.  He remains on 10 mg rosuvastatin.  As mentioned above his calcium score was quite high with the 2nd percentile for age and sex matched control.  He remains asymptomatic.  It is difficult to know what the changes in his lipids other than possibly less physical activity.  Diet is been fairly stable  07/16/2020  John Hendricks is seen today in follow-up.  Overall he says he is doing well.  He has an appointment with Dr. Osborne Casco this summer.  We are working on dietary changes to help with additional lipid lowering and I increased his rosuvastatin to 40 mg daily to try to target LDL less than 70, given extensive coronary calcification.  He is recent lipids showed total cholesterol 156, triglycerides 92, HDL 53 and LDL 86, still somewhat above target.  This is despite optimizing his diet and being fairly close to a normal weight.  We discussed options at this point.  11/30/2020  John Hendricks returns today for follow-up.  He continues to do well on the ezetimibe.  This seems to be well-tolerated.  His lipids have improved significantly.  His last total cholesterol was 130, triglycerides 97, HDL  59 and LDL 52 as of June 2022.  Blood pressure is well controlled.  He is asymptomatic.  PMHx:  Past Medical History:  Diagnosis Date   Arthritis    hands   Hx of colonic polyp 11/12/2017    Past Surgical History:  Procedure Laterality Date   COLONOSCOPY     TOOTH EXTRACTION      FAMHx:  Family History  Problem Relation Age of Onset   Parkinson's disease Father        Deceased   Heart attack Mother        Mid-2s   Dementia Mother        Living   Breast  cancer Mother    Heart disease Mother    Healthy Sister    Healthy Son    Healthy Daughter    Heart attack Maternal Grandfather    Heart attack Maternal Grandmother     SOCHx:   reports that he has never smoked. He has never used smokeless tobacco. He reports current alcohol use of about 7.0 standard drinks per week. He reports that he does not use drugs.  ALLERGIES:  No Known Allergies  ROS: Pertinent items noted in HPI and remainder of comprehensive ROS otherwise negative.  HOME MEDS: Current Outpatient Medications on File Prior to Visit  Medication Sig Dispense Refill   aspirin 81 MG EC tablet      rosuvastatin (CRESTOR) 40 MG tablet Take 1 tablet (40 mg total) by mouth daily. 90 tablet 3   ezetimibe (ZETIA) 10 MG tablet Take 1 tablet (10 mg total) by mouth daily. 90 tablet 3   No current facility-administered medications on file prior to visit.    LABS/IMAGING: No results found for this or any previous visit (from the past 48 hour(s)).  No results found.  LIPID PANEL:    Component Value Date/Time   CHOL 156 07/15/2020 0926   TRIG 92 07/15/2020 0926   HDL 53 07/15/2020 0926   CHOLHDL 2.9 07/15/2020 0926   CHOLHDL 5 07/09/2017 1528   VLDL 43.8 (H) 07/09/2017 1528   LDLCALC 86 07/15/2020 0926   LDLDIRECT 152.0 07/09/2017 1528    WEIGHTS: Wt Readings from Last 3 Encounters:  11/30/20 186 lb 9.6 oz (84.6 kg)  07/16/20 186 lb 3.2 oz (84.5 kg)  03/26/20 183 lb 6.4 oz (83.2 kg)    VITALS: BP 124/70 (BP Location: Left Arm)   Pulse 80   Ht 6' (1.829 m)   Wt 186 lb 9.6 oz (84.6 kg)   SpO2 99%   BMI 25.31 kg/m   EXAM: Deferred  EKG: Deferred  ASSESSMENT: Mixed dyslipidemia, goal LDL less than 70 Multivessel coronary artery disease with CAC score of 733, the 92nd percentile for age and sex matched control Family history of premature coronary disease in his mother and grandparents Sedentary lifestyle  PLAN: 1.   John Hendricks has had a good response to  ezetimibe and is reached target LDL less than 70.  Although he has multivessel coronary artery disease, he is asymptomatic.  We will continue to monitor his symptoms and encouraged exercise is more likely to have any kind of symptoms to be detected earlier with exercise.  There is probably little role for repeat calcium scoring.  Certainly if he is asymptomatic there may be indications for stress testing or cardiac catheterization at some point but for now we will continue with aggressive medical therapy.  Follow-up with me annually or sooner as necessary.  Pixie Casino, MD,  FACC, [place] Director of the Advanced Lipid Disorders &  Cardiovascular Risk Reduction Clinic Diplomate of the American Board of Clinical Lipidology Attending Cardiologist  Direct Dial: [phone]  Fax: [phone]  Website:  [email or web address] 11/30/2020, 8:16 AM

## 2021-02-12 DIAGNOSIS — Z23 Encounter for immunization: Secondary | ICD-10-CM | POA: Diagnosis not present

## 2021-03-19 ENCOUNTER — Other Ambulatory Visit: Payer: Self-pay | Admitting: Internal Medicine

## 2021-04-20 DIAGNOSIS — L821 Other seborrheic keratosis: Secondary | ICD-10-CM | POA: Diagnosis not present

## 2021-04-20 DIAGNOSIS — L814 Other melanin hyperpigmentation: Secondary | ICD-10-CM | POA: Diagnosis not present

## 2021-04-20 DIAGNOSIS — Z85828 Personal history of other malignant neoplasm of skin: Secondary | ICD-10-CM | POA: Diagnosis not present

## 2021-04-20 DIAGNOSIS — L570 Actinic keratosis: Secondary | ICD-10-CM | POA: Diagnosis not present

## 2021-04-20 DIAGNOSIS — D225 Melanocytic nevi of trunk: Secondary | ICD-10-CM | POA: Diagnosis not present

## 2021-04-26 DIAGNOSIS — Z9841 Cataract extraction status, right eye: Secondary | ICD-10-CM | POA: Diagnosis not present

## 2021-10-21 DIAGNOSIS — R972 Elevated prostate specific antigen [PSA]: Secondary | ICD-10-CM | POA: Diagnosis not present

## 2021-10-21 DIAGNOSIS — Z125 Encounter for screening for malignant neoplasm of prostate: Secondary | ICD-10-CM | POA: Diagnosis not present

## 2021-10-21 DIAGNOSIS — E7800 Pure hypercholesterolemia, unspecified: Secondary | ICD-10-CM | POA: Diagnosis not present

## 2021-10-23 DIAGNOSIS — Z0000 Encounter for general adult medical examination without abnormal findings: Secondary | ICD-10-CM | POA: Diagnosis not present

## 2021-10-28 DIAGNOSIS — E7800 Pure hypercholesterolemia, unspecified: Secondary | ICD-10-CM | POA: Diagnosis not present

## 2021-10-28 DIAGNOSIS — Z1331 Encounter for screening for depression: Secondary | ICD-10-CM | POA: Diagnosis not present

## 2021-10-28 DIAGNOSIS — Z0000 Encounter for general adult medical examination without abnormal findings: Secondary | ICD-10-CM | POA: Diagnosis not present

## 2021-10-28 DIAGNOSIS — R82998 Other abnormal findings in urine: Secondary | ICD-10-CM | POA: Diagnosis not present

## 2021-10-28 DIAGNOSIS — Z1339 Encounter for screening examination for other mental health and behavioral disorders: Secondary | ICD-10-CM | POA: Diagnosis not present

## 2021-12-13 ENCOUNTER — Ambulatory Visit (HOSPITAL_BASED_OUTPATIENT_CLINIC_OR_DEPARTMENT_OTHER): Payer: BC Managed Care – PPO | Admitting: Internal Medicine

## 2022-01-02 DIAGNOSIS — Z125 Encounter for screening for malignant neoplasm of prostate: Secondary | ICD-10-CM | POA: Diagnosis not present

## 2022-01-15 ENCOUNTER — Other Ambulatory Visit: Payer: Self-pay | Admitting: Internal Medicine

## 2022-01-16 MED ORDER — EZETIMIBE 10 MG PO TABS: 10.0000 mg | ORAL_TABLET | Freq: Every day | ORAL | 0 refills | Status: DC

## 2022-01-19 ENCOUNTER — Encounter (HOSPITAL_BASED_OUTPATIENT_CLINIC_OR_DEPARTMENT_OTHER): Payer: Self-pay | Admitting: Internal Medicine

## 2022-01-19 ENCOUNTER — Ambulatory Visit (INDEPENDENT_AMBULATORY_CARE_PROVIDER_SITE_OTHER): Payer: BC Managed Care – PPO | Admitting: Internal Medicine

## 2022-01-19 VITALS — BP 103/68 | HR 62 | Ht 72.0 in | Wt 186.9 lb

## 2022-01-19 DIAGNOSIS — R931 Abnormal findings on diagnostic imaging of heart and coronary circulation: Secondary | ICD-10-CM

## 2022-01-19 DIAGNOSIS — Z8249 Family history of ischemic heart disease and other diseases of the circulatory system: Secondary | ICD-10-CM

## 2022-01-19 DIAGNOSIS — E785 Hyperlipidemia, unspecified: Secondary | ICD-10-CM | POA: Diagnosis not present

## 2022-01-19 MED ORDER — EZETIMIBE 10 MG PO TABS: 10.0000 mg | ORAL_TABLET | Freq: Every day | ORAL | 3 refills | Status: DC

## 2022-01-19 MED ORDER — ROSUVASTATIN CALCIUM 40 MG PO TABS: 40.0000 mg | ORAL_TABLET | Freq: Every day | ORAL | 3 refills | Status: DC

## 2022-01-19 NOTE — Progress Notes (Signed)
LIPID CLINIC CONSULT NOTE  Chief Complaint:  Follow-up dyslipidemia, high CAC score  Primary Care Physician: Tisovec, Fransico Him, MD  Primary Cardiologist:  None  HPI:  John Hendricks is a 66 y.o. male who is being seen today for the evaluation of dyslipidemia at the request of Tisovec, Fransico Him, MD.  This is a pleasant 66 year old male kindly referred by Dr. Osborne Casco for evaluation management of dyslipidemia and high CAC score.  John Hendricks is self-employed at a [employer] and works from home and is fairly sedentary job.  Past medical history is not that significant.  More recently established care at [facility].  During his physical was noted to have elevated cholesterol.  His total cholesterol was 264, triglycerides 222, HDL 54 and LDL 166.  Non-HDL cholesterol 210.  Subsequently was started on rosuvastatin 10 mg daily which is tolerated for the past several months as well as low-dose aspirin 81 mg daily.  Family history significant for MI in his mother possibly in her 20s or 6s he received several stents and ultimately had one of the first TAVR procedures.  She is now deceased.  His grandparents both had coronary artery disease.  Despite this he reports being asymptomatic.  He said this past weekend he ripped out some foundation bushes and was able to do digging and working with a hatchet for about 45 minutes before he became fatigued.  That being said, he had recent coronary artery calcium scoring which was significantly abnormal indicating a total calcium score of 733, 92nd percentile for age and sex matched control.  He was noted to have multivessel CAD.  Additionally had some granulomatous nodules in his lungs.  I discussed the significance of his calcium score today suggesting there is early onset heart disease.  We also discussed the ischemia trial which he actually has previously read and agreed that maximal medical therapy is likely the best first route in this  situation.  In fact no longer recommend stress testing in these patients despite high calcium scores if they are completely asymptomatic.  03/26/2020  John Hendricks returns today for follow-up.  He had repeat lipids drawn which show an increase in LDL cholesterol compared to levels about 2 months ago.  His total is 169 up from 160 and LDL of 96 up from 86.  He remains on 10 mg rosuvastatin.  As mentioned above his calcium score was quite high with the 2nd percentile for age and sex matched control.  He remains asymptomatic.  It is difficult to know what the changes in his lipids other than possibly less physical activity.  Diet is been fairly stable  07/16/2020  John Hendricks is seen today in follow-up.  Overall he says he is doing well.  He has an appointment with Dr. Osborne Casco this summer.  We are working on dietary changes to help with additional lipid lowering and I increased his rosuvastatin to 40 mg daily to try to target LDL less than 70, given extensive coronary calcification.  He is recent lipids showed total cholesterol 156, triglycerides 92, HDL 53 and LDL 86, still somewhat above target.  This is despite optimizing his diet and being fairly close to a normal weight.  We discussed options at this point.  11/30/2020  John Hendricks returns today for follow-up.  He continues to do well on the ezetimibe.  This seems to be well-tolerated.  His lipids have improved significantly.  His last total cholesterol was 130, triglycerides 97, HDL  59 and LDL 52 as of June 2022.  Blood pressure is well controlled.  He is asymptomatic.  01/19/2022  John Hendricks returns today for follow-up.  He continues to do well on combination of ezetimibe and rosuvastatin.  Recent lipids this July showed total cholesterol 119, triglycerides 102, HDL 54 and LDL 45.  I do not have any evidence that he has had a prior LP(a) assessed.  I suspect he does have a genetic cholesterol disorder given his extensive coronary calcification.  I would  like him to get this checked at some point although it may not change management at this time, he might be a candidate for future/emerging therapies.  PMHx:  Past Medical History:  Diagnosis Date   Arthritis    hands   Hx of colonic polyp 11/12/2017    Past Surgical History:  Procedure Laterality Date   COLONOSCOPY     TOOTH EXTRACTION      FAMHx:  Family History  Problem Relation Age of Onset   Parkinson's disease Father        Deceased   Heart attack Mother        Mid-69s   Dementia Mother        Living   Breast cancer Mother    Heart disease Mother    Healthy Sister    Healthy Son    Healthy Daughter    Heart attack Maternal Grandfather    Heart attack Maternal Grandmother     SOCHx:   reports that he has never smoked. He has never used smokeless tobacco. He reports current alcohol use of about 7.0 standard drinks of alcohol per week. He reports that he does not use drugs.  ALLERGIES:  No Known Allergies  ROS: Pertinent items noted in HPI and remainder of comprehensive ROS otherwise negative.  HOME MEDS: Current Outpatient Medications on File Prior to Visit  Medication Sig Dispense Refill   aspirin 81 MG EC tablet      ezetimibe (ZETIA) 10 MG tablet Take 1 tablet (10 mg total) by mouth daily. Schedule an appointment for further refills 30 tablet 0   rosuvastatin (CRESTOR) 40 MG tablet TAKE 1 TABLET BY MOUTH EVERY DAY 90 tablet 3   No current facility-administered medications on file prior to visit.    LABS/IMAGING: No results found for this or any previous visit (from the past 48 hour(s)).  No results found.  LIPID PANEL:    Component Value Date/Time   CHOL 156 07/15/2020 0926   TRIG 92 07/15/2020 0926   HDL 53 07/15/2020 0926   CHOLHDL 2.9 07/15/2020 0926   CHOLHDL 5 07/09/2017 1528   VLDL 43.8 (H) 07/09/2017 1528   LDLCALC 86 07/15/2020 0926   LDLDIRECT 152.0 07/09/2017 1528    WEIGHTS: Wt Readings from Last 3 Encounters:  01/19/22 186 lb  14.4 oz (84.8 kg)  11/30/20 186 lb 9.6 oz (84.6 kg)  07/16/20 186 lb 3.2 oz (84.5 kg)    VITALS: BP 103/68 (BP Location: Right Arm, Patient Position: Sitting, Cuff Size: Normal)   Pulse 62   Ht 6' (1.829 m)   Wt 186 lb 14.4 oz (84.8 kg)   SpO2 100%   BMI 25.35 kg/m   EXAM: Deferred  EKG: Deferred  ASSESSMENT: Mixed dyslipidemia, goal LDL less than 70 Multivessel coronary artery disease with CAC score of 733, the 92nd percentile for age and sex matched control Family history of premature coronary disease in his mother and grandparents Sedentary lifestyle  PLAN: 1.  John Hendricks has a mixed dyslipidemia which is well controlled on combination rosuvastatin and ezetimibe.  He would benefit from an LP(a) assessment since he has never had one.  He does have pretty high calcium score for his age but is asymptomatic.  He continues to be somewhat active and noted that he walked 18 holes of golf the other day which is unusual for him.  He had no issues with that.   Follow-up with me annually or sooner as necessary.  Pixie Casino, MD, [facility], [place] Director of the Advanced Lipid Disorders &  Cardiovascular Risk Reduction Clinic Diplomate of the American Board of Clinical Lipidology Attending Cardiologist  Direct Dial: [phone]  Fax: [phone]  Website:  [email or web address] 01/19/2022, 10:06 AM

## 2022-01-19 NOTE — Patient Instructions (Signed)
Medication Instructions:  NO CHANGES  *If you need a refill on your cardiac medications before your next appointment, please call your pharmacy*   Lab Work: Lipoprotein A -- at your earliest convenience   FASTING lipid panel in 1 year  If you have labs (blood work) drawn today and your tests are completely normal, you will receive your results only by: [place] (if you have MyChart) OR A paper copy in the mail If you have any lab test that is abnormal or we need to change your treatment, we will call you to review the results.    Follow-Up: At [facility], you and your health needs are our priority.  As part of our continuing mission to provide you with exceptional heart care, we have created designated Provider Care Teams.  These Care Teams include your primary Cardiologist (physician) and Advanced Practice Providers (APPs -  Physician Assistants and Nurse Practitioners) who all work together to provide you with the care you need, when you need it.  We recommend signing up for the patient portal called "MyChart".  Sign up information is provided on this After Visit Summary.  MyChart is used to connect with patients for Virtual Visits (Telemedicine).  Patients are able to view lab/test results, encounter notes, upcoming appointments, etc.  Non-urgent messages can be sent to your provider as well.   To learn more about what you can do with MyChart, go to [email or web address].    Your next appointment:    12 months with Dr. Debara Pickett

## 2022-01-23 DIAGNOSIS — R972 Elevated prostate specific antigen [PSA]: Secondary | ICD-10-CM | POA: Diagnosis not present

## 2022-01-24 ENCOUNTER — Other Ambulatory Visit: Payer: Self-pay | Admitting: Urology

## 2022-01-24 DIAGNOSIS — R972 Elevated prostate specific antigen [PSA]: Secondary | ICD-10-CM

## 2022-01-28 DIAGNOSIS — Z23 Encounter for immunization: Secondary | ICD-10-CM | POA: Diagnosis not present

## 2022-02-16 ENCOUNTER — Ambulatory Visit
Admission: RE | Admit: 2022-02-16 | Discharge: 2022-02-16 | Disposition: A | Discharge status: Home / Self Care | Payer: BC Managed Care – PPO | Source: Ambulatory Visit | Attending: Urology | Admitting: Urology

## 2022-02-16 DIAGNOSIS — R972 Elevated prostate specific antigen [PSA]: Secondary | ICD-10-CM

## 2022-02-16 MED ORDER — GADOPICLENOL 0.5 MMOL/ML IV SOLN
7.0000 mL | Freq: Once | INTRAVENOUS | Status: AC | PRN
  Administered 2022-02-16: 7 mL via INTRAVENOUS

## 2022-04-13 IMAGING — CT CT CARDIAC CORONARY ARTERY CALCIUM SCORE
3 series · 13 of 20 positions shown, 15 images · non-contrast
Comparison: None.

CLINICAL DATA: Increased cholesterol

EXAM:
CT CARDIAC CORONARY ARTERY CALCIUM SCORE
TECHNIQUE: Non-contrast imaging through the heart was performed using
prospective ECG gating. Image post processing was performed on an
independent workstation, allowing for quantitative analysis of the
heart and coronary arteries. Note that this exam targets the heart
and the chest was not imaged in its entirety.

[Series 2: calcium scoring 2.00 qr36 bestdiast 70% hrt calciu · axial · 0.40mm/px · z∈[+1801,+1853]mm · 3 of 67 slices shown]
[im 14/67  vessel]
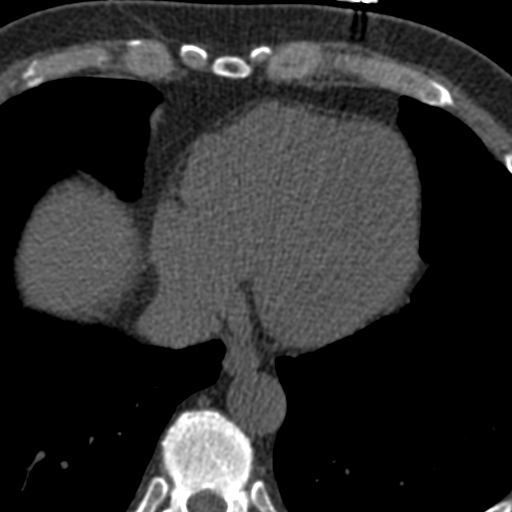
[im 27/67  vessel]
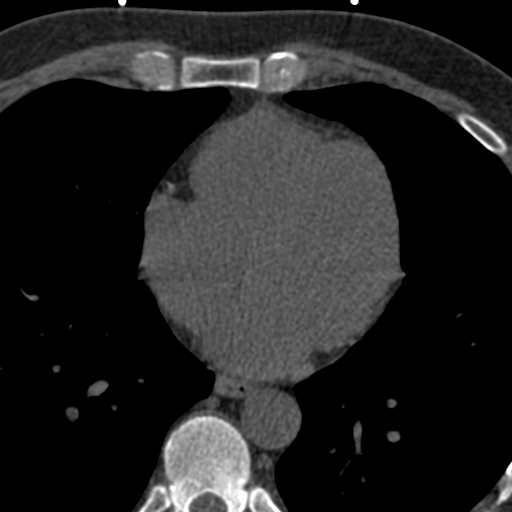
[im 40/67  vessel]
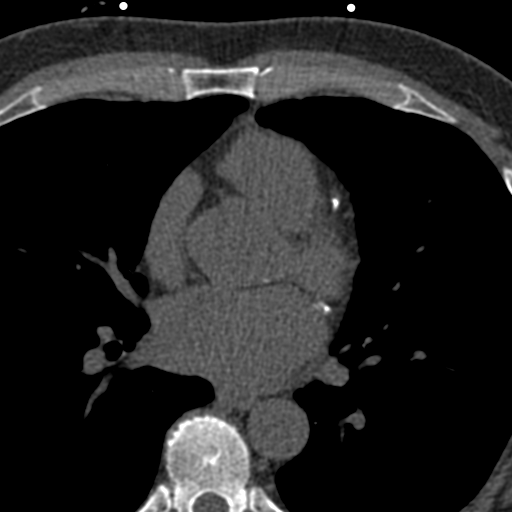

[Series 3: calcium scoring 2.00 br40 bestdiast 70% axial · axial · 0.54mm/px · z∈[+1797,+1885]mm · 5 of 67 slices shown, 7 images]
[im 12/67  vessel]
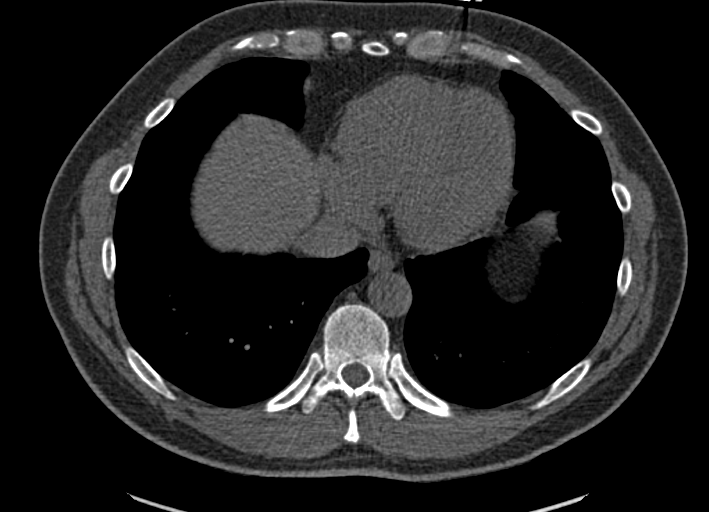
[im 12/67  lung]
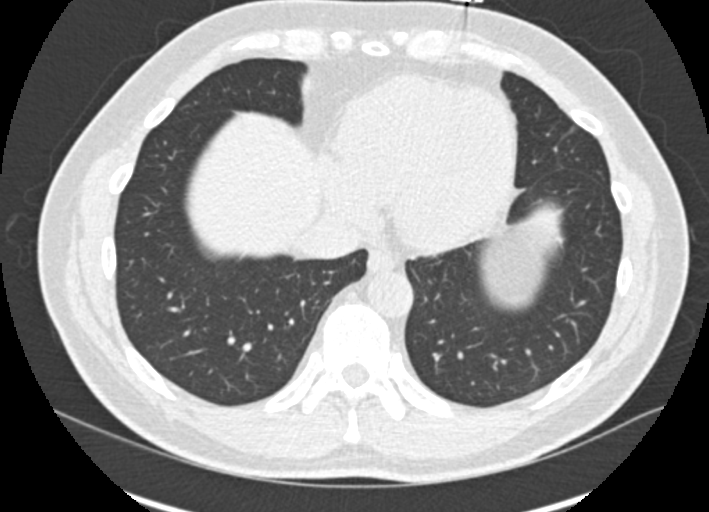
[im 23/67  vessel]
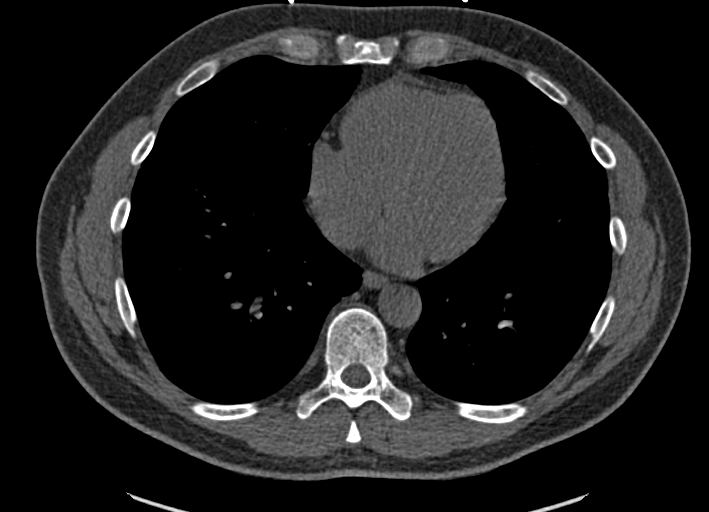
[im 34/67  vessel]
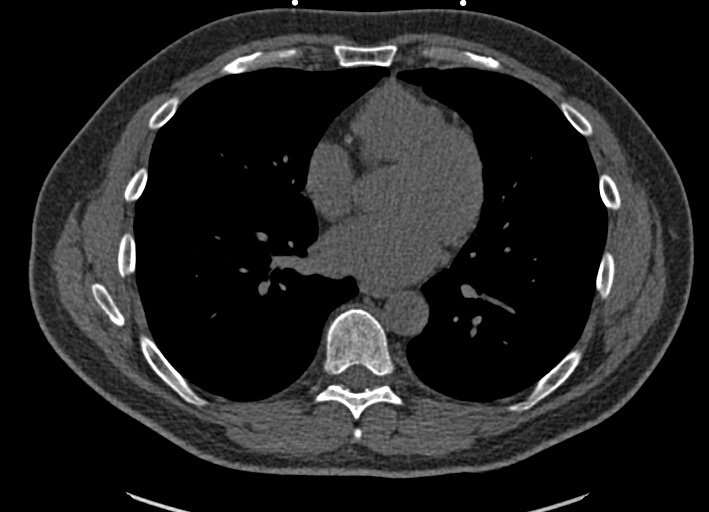
[im 45/67  vessel]
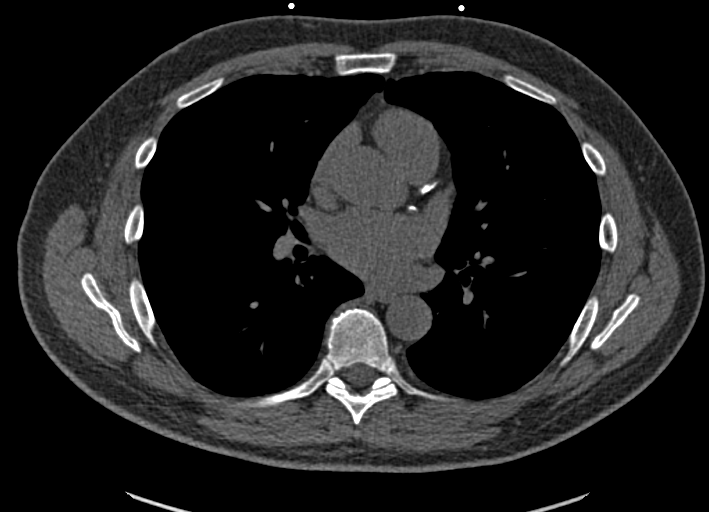
[im 56/67  vessel]
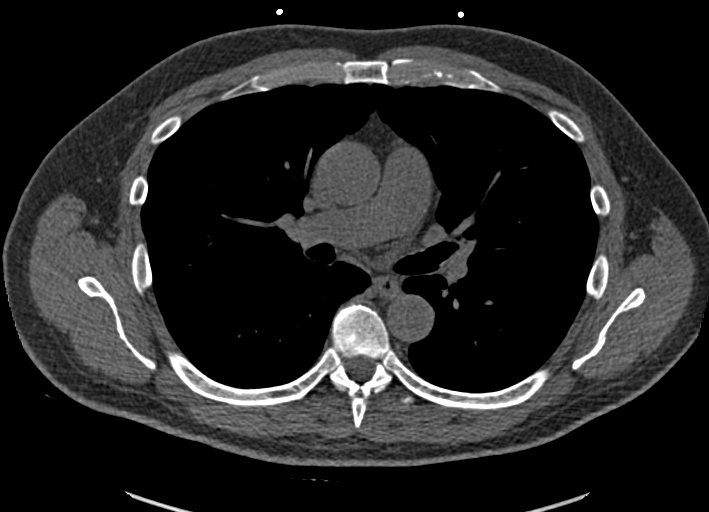
[im 56/67  lung]
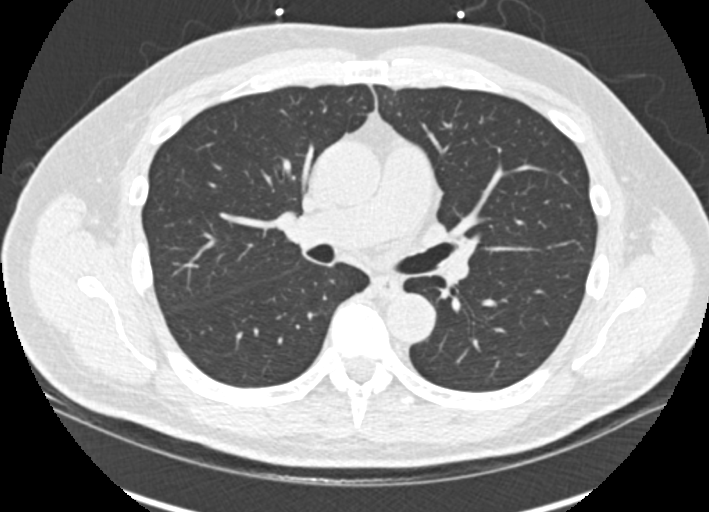

[Series 9: calcium scoring 2.00 br60 bestdiast 70% lungs · axial · 0.54mm/px · z∈[+1797,+1885]mm · 5 of 67 slices shown]
[im 12/67  vessel]
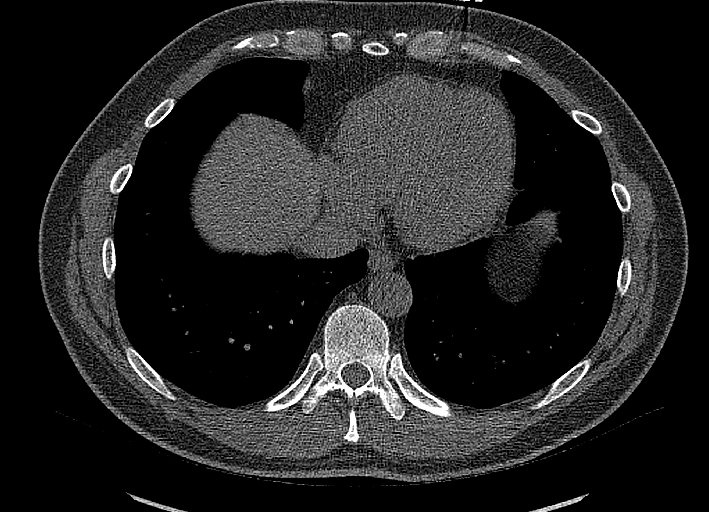
[im 23/67  vessel]
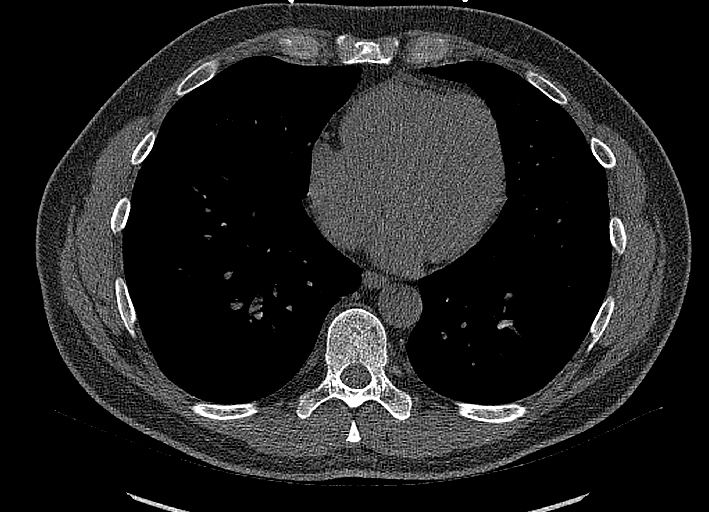
[im 34/67  vessel]
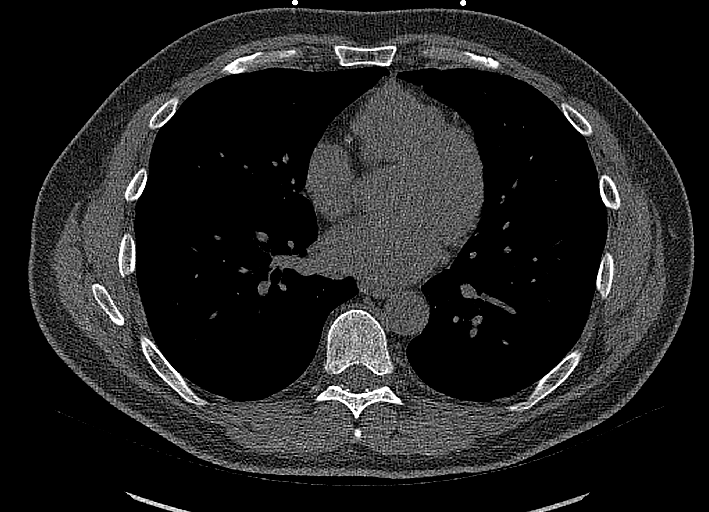
[im 45/67  vessel]
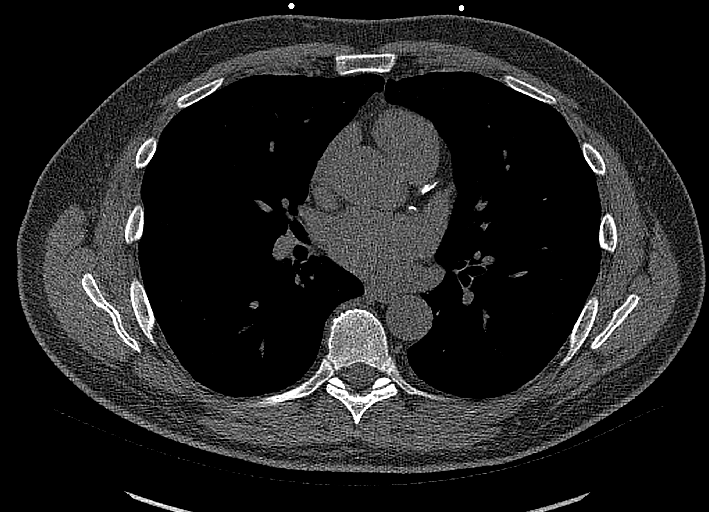
[im 56/67  vessel]
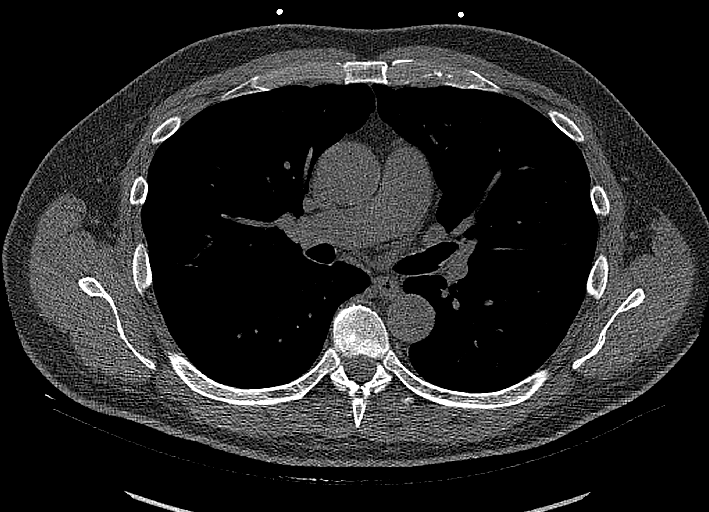

[13 of 20 positions shown; findings below may reference images not displayed]

FINDINGS: CORONARY CALCIUM SCORES:

Left Main: 3

LAD: 251

LCx: 307

RCA: 172

Total Agatston Score: 733

[HOSPITAL] percentile: 92

AORTA MEASUREMENTS:

Ascending Aorta: 32 mm

Descending Aorta: 24 mm

OTHER FINDINGS:

Heart is normal size. Aorta normal caliber. No adenopathy. Calcified
granulomas bilaterally. No noncalcified nodules, confluent opacities
or effusions. Imaging into the upper abdomen shows no acute
findings. Chest wall soft tissues are unremarkable. No acute bony
abnormality.
IMPRESSION: The observed calcium score of 733 is at the percentile 92 for
subjects of the same age, gender and race/ethnicity who are free of
clinical cardiovascular disease and treated diabetes.

No acute or significant extracardiac abnormality

## 2022-09-05 ENCOUNTER — Other Ambulatory Visit: Payer: Self-pay | Admitting: Urology

## 2022-09-05 DIAGNOSIS — C61 Malignant neoplasm of prostate: Secondary | ICD-10-CM

## 2022-10-11 ENCOUNTER — Ambulatory Visit
Admission: RE | Admit: 2022-10-11 | Discharge: 2022-10-11 | Disposition: A | Discharge status: Home / Self Care | Payer: Medicare Other | Source: Ambulatory Visit | Attending: Urology | Admitting: Urology

## 2022-10-11 DIAGNOSIS — C61 Malignant neoplasm of prostate: Secondary | ICD-10-CM

## 2022-10-11 MED ORDER — GADOPICLENOL 0.5 MMOL/ML IV SOLN
8.0000 mL | Freq: Once | INTRAVENOUS | Status: AC | PRN
  Administered 2022-10-11: 8 mL via INTRAVENOUS

## 2022-11-21 ENCOUNTER — Other Ambulatory Visit: Payer: Self-pay | Admitting: Internal Medicine

## 2022-11-21 DIAGNOSIS — R931 Abnormal findings on diagnostic imaging of heart and coronary circulation: Secondary | ICD-10-CM

## 2022-11-21 DIAGNOSIS — E785 Hyperlipidemia, unspecified: Secondary | ICD-10-CM

## 2022-11-21 DIAGNOSIS — Z8249 Family history of ischemic heart disease and other diseases of the circulatory system: Secondary | ICD-10-CM

## 2022-12-10 ENCOUNTER — Telehealth: Payer: Self-pay | Admitting: Internal Medicine

## 2022-12-10 DIAGNOSIS — Z86010 Personal history of colonic polyps: Secondary | ICD-10-CM

## 2022-12-10 DIAGNOSIS — R195 Other fecal abnormalities: Secondary | ICD-10-CM

## 2022-12-10 NOTE — Telephone Encounter (Signed)
I was reviewing chart for recalls and see that he has Oct appt for heme = stool  He has hx colon polyp and is due for colonoscopy  Please do the following:  1) Call patient and explain we can schedule direct colonoscopy and do so unless he thinks he needs to be seen  2) Cancel appt w/ Quentin Mulling  Encounter Diagnoses  Name Primary?   Hx of colonic polyp Yes   Heme + stool

## 2022-12-11 NOTE — Telephone Encounter (Signed)
Pt made aware of Dr. Leone Payor recommendations: Appointment with Quentin Mulling PA canceled.  Pt was scheduled for a previsit appointment on 12/27/2022 at 1:00 PM. Pt made aware.  Pt was scheduled for a colonoscopy on 01/11/2023 at 10:00 AM with Dr. Leone Payor.  Pt made aware.  Pt verbalized understanding with all questions answered.

## 2022-12-27 ENCOUNTER — Ambulatory Visit (AMBULATORY_SURGERY_CENTER): Payer: Medicare Other | Admitting: *Deleted

## 2022-12-27 VITALS — Ht 72.0 in | Wt 187.0 lb

## 2022-12-27 DIAGNOSIS — Z86010 Personal history of colonic polyps: Secondary | ICD-10-CM

## 2022-12-27 DIAGNOSIS — R195 Other fecal abnormalities: Secondary | ICD-10-CM

## 2022-12-27 NOTE — Progress Notes (Signed)
Pt's name and DOB verified at the beginning of the pre-visit.  Pt denies any difficulty with ambulating,sitting, laying down or rolling side to side Gave both [facility] main # and MD on call # prior to instructions.  No egg or soy allergy known to patient  No issues known to pt with past sedation with any surgeries or procedures Pt denies having issues being intubated Patient denies ever being intubated Pt has no issues moving head neck or swallowing No FH of Malignant Hyperthermia Pt is not on diet pills Pt is not on home 02  Pt is not on blood thinners  Pt denies issues with constipation  Pt is not on dialysis Pt denise any abnormal heart rhythms  Pt denies any upcoming cardiac testing Pt encouraged to use to use Singlecare or Goodrx to reduce cost  Patient's chart reviewed by Cathlyn Parsons CNRA prior to pre-visit and patient appropriate for the [facility].  Pre-visit completed and red dot placed by patient's name on their procedure day (on provider's schedule).  . Visit by phone Pt states weight is 187 lb  Instructed pt why it is important to and  to call if they have any changes in health or new medications. Directed them to the # given and on instructions.   Pt states they will.  Instructions reviewed with pt and pt states understanding. Instructed to review again prior to procedure. Pt states they will.  Instructions sent by mail with coupon and by my chart \

## 2023-01-07 ENCOUNTER — Encounter: Payer: Self-pay | Admitting: Certified Registered Nurse Anesthetist

## 2023-01-11 ENCOUNTER — Encounter: Payer: Self-pay | Admitting: Internal Medicine

## 2023-01-11 ENCOUNTER — Ambulatory Visit (AMBULATORY_SURGERY_CENTER): Payer: Medicare Other | Admitting: Internal Medicine

## 2023-01-11 VITALS — BP 119/76 | HR 60 | Temp 97.0°F | Resp 16 | Ht 72.0 in | Wt 187.0 lb

## 2023-01-11 DIAGNOSIS — R195 Other fecal abnormalities: Secondary | ICD-10-CM

## 2023-01-11 DIAGNOSIS — Z09 Encounter for follow-up examination after completed treatment for conditions other than malignant neoplasm: Secondary | ICD-10-CM | POA: Diagnosis not present

## 2023-01-11 DIAGNOSIS — Z86010 Personal history of colonic polyps: Secondary | ICD-10-CM

## 2023-01-11 MED ORDER — SODIUM CHLORIDE 0.9 % IV SOLN: 500.0000 mL | Freq: Once | INTRAVENOUS | Status: DC

## 2023-01-11 NOTE — Op Note (Signed)
[facility] Patient Name: John Hendricks Procedure Date: 01/11/2023 9:31 AM MRN: [id number] Endoscopist: Iva Boop , MD, [id number] Age: 67 Referring MD:  Date of Birth: Nov 11, 1955 Gender: Male Account #: [id number] Procedure:                Colonoscopy Indications:              Heme positive stool and has hx 8 mm sessile                            serrated polyp removed 2019 Medicines:                Monitored Anesthesia Care Procedure:                Pre-Anesthesia Assessment:                           - Prior to the procedure, a History and Physical                            was performed, and patient medications and                            allergies were reviewed. The patient's tolerance of                            previous anesthesia was also reviewed. The risks                            and benefits of the procedure and the sedation                            options and risks were discussed with the patient.                            All questions were answered, and informed consent                            was obtained. Prior Anticoagulants: The patient has                            taken no anticoagulant or antiplatelet agents. ASA                            Grade Assessment: II - A patient with mild systemic                            disease. After reviewing the risks and benefits,                            the patient was deemed in satisfactory condition to                            undergo the procedure.  After obtaining informed consent, the colonoscope                            was passed under direct vision. Throughout the                            procedure, the patient's blood pressure, pulse, and                            oxygen saturations were monitored continuously. The                            Olympus Scope SN: [id number] was introduced through                            the anus and advanced to the the  cecum, identified                            by appendiceal orifice and ileocecal valve. The                            colonoscopy was performed without difficulty. The                            patient tolerated the procedure well. The quality                            of the bowel preparation was good. The ileocecal                            valve, appendiceal orifice, and rectum were                            photographed. The bowel preparation used was                            Miralax via split dose instruction. Scope In: 9:57:52 AM Scope Out: 10:14:30 AM Scope Withdrawal Time: 0 hours 14 minutes 29 seconds  Total Procedure Duration: 0 hours 16 minutes 38 seconds  Findings:                 The perianal and digital rectal examinations were                            normal.                           Internal hemorrhoids were found. The hemorrhoids                            were small.                           The exam was otherwise without abnormality on  direct and retroflexion views. Complications:            No immediate complications. Estimated Blood Loss:     Estimated blood loss: none. Impression:               - Internal hemorrhoids.                           - The examination was otherwise normal on direct                            and retroflexion views.                           - No specimens collected.                           - Personal history of colonic polyp 8 mm ssp 2019. Recommendation:           - Patient has a contact number available for                            emergencies. The signs and symptoms of potential                            delayed complications were discussed with the                            patient. Return to normal activities tomorrow.                            Written discharge instructions were provided to the                            patient.                           - Resume previous diet.                            - Continue present medications.                           - Repeat colonoscopy in 10 years. that can be next                            test - should not need routine hemoccults Iva Boop, MD 01/11/2023 10:22:24 AM This report has been signed electronically.

## 2023-01-11 NOTE — Progress Notes (Signed)
Pt's states no medical or surgical changes since previsit or office visit. 

## 2023-01-11 NOTE — Patient Instructions (Addendum)
I found some small swollen hemorrhoids - no polyps or cancer were seen.  I believe hemorrhoids caused abnormal stool test.  Next routine colonoscopy or other screening test in 10 years - 2034.  I do not recommend routine hemoccult testing of stool for you.  I appreciate the opportunity to care for you. Iva Boop, MD, [facility]  Recommendation:           - Patient has a contact number available for                            emergencies. The signs and symptoms of potential                            delayed complications were discussed with the                            patient. Return to normal activities tomorrow.                            Written discharge instructions were provided to the                            patient.                           - Resume previous diet.                           - Continue present medications.                           - Repeat colonoscopy in 10 years. that can be next                            test - should not need routine hemoccults  Handout on Hemorrhoids given.  YOU HAD AN ENDOSCOPIC PROCEDURE TODAY AT THE [facility]:   Refer to the procedure report that was given to you for any specific questions about what was found during the examination.  If the procedure report does not answer your questions, please call your gastroenterologist to clarify.  If you requested that your care partner not be given the details of your procedure findings, then the procedure report has been included in a sealed envelope for you to review at your convenience later.  YOU SHOULD EXPECT: Some feelings of bloating in the abdomen. Passage of more gas than usual.  Walking can help get rid of the air that was put into your GI tract during the procedure and reduce the bloating. If you had a lower endoscopy (such as a colonoscopy or flexible sigmoidoscopy) you may notice spotting of blood in your stool or on the toilet paper. If you underwent a bowel prep  for your procedure, you may not have a normal bowel movement for a few days.  Please Note:  You might notice some irritation and congestion in your nose or some drainage.  This is from the oxygen used during your procedure.  There is no need for concern and it should clear up in a day or so.  SYMPTOMS TO  REPORT IMMEDIATELY:  Following lower endoscopy (colonoscopy or flexible sigmoidoscopy):  Excessive amounts of blood in the stool  Significant tenderness or worsening of abdominal pains  Swelling of the abdomen that is new, acute  Fever of 100F or higher   For urgent or emergent issues, a gastroenterologist can be reached at any hour by calling [phone]. Do not use MyChart messaging for urgent concerns.    DIET:  We do recommend a small meal at first, but then you may proceed to your regular diet.  Drink plenty of fluids but you should avoid alcoholic beverages for 24 hours.  ACTIVITY:  You should plan to take it easy for the rest of today and you should NOT DRIVE or use heavy machinery until tomorrow (because of the sedation medicines used during the test).    FOLLOW UP: Our staff will call the number listed on your records the next business day following your procedure.  We will call around 7:15- 8:00 am to check on you and address any questions or concerns that you may have regarding the information given to you following your procedure. If we do not reach you, we will leave a message.     If any biopsies were taken you will be contacted by phone or by letter within the next 1-3 weeks.  Please call us at [phone] if you have not heard about the biopsies in 3 weeks.    SIGNATURES/CONFIDENTIALITY: You and/or your care partner have signed paperwork which will be entered into your electronic medical record.  These signatures attest to the fact that that the information above on your After Visit Summary has been reviewed and is understood.  Full responsibility of the  confidentiality of this discharge information lies with you and/or your care-partner.

## 2023-01-11 NOTE — Progress Notes (Signed)
[facility] History and Physical   Primary Care Physician:  Tisovec, Adelfa Koh, MD   Reason for Procedure:    Encounter Diagnoses  Name Primary?   Personal history of colonic polyps Yes   Heme + stool      Plan:    colonoscopy     HPI: John Hendricks is a 67 y.o. male here for surveillance exam.  Also had + FIT test  10/2017 8 mm ssp recall 2024   Past Surgical History:  Procedure Laterality Date   COLONOSCOPY     MOS surgery     TOOTH EXTRACTION     VASECTOMY      Prior to Admission medications   Medication Sig Start Date End Date Taking? Authorizing Provider  aspirin 81 MG EC tablet  11/19/19  Yes [provider]  ezetimibe (ZETIA) 10 MG tablet Take 1 tablet (10 mg total) by mouth daily. 01/19/22 01/14/23 Yes Hilty, Lisette Abu, MD  rosuvastatin (CRESTOR) 40 MG tablet Take 1 tablet (40 mg total) by mouth daily. 01/19/22  Yes Hilty, Lisette Abu, MD    Current Outpatient Medications  Medication Sig Dispense Refill   aspirin 81 MG EC tablet      ezetimibe (ZETIA) 10 MG tablet Take 1 tablet (10 mg total) by mouth daily. 90 tablet 3   rosuvastatin (CRESTOR) 40 MG tablet Take 1 tablet (40 mg total) by mouth daily. 90 tablet 3   Current Facility-Administered Medications  Medication Dose Route Frequency Provider Last Rate Last Admin   0.9 %  sodium chloride infusion  500 mL Intravenous Once Iva Boop, MD        Allergies as of 01/11/2023   (No Known Allergies)    Family History  Problem Relation Age of Onset   Heart attack Mother        Mid-60s   Dementia Mother        Living   Breast cancer Mother    Heart disease Mother    Parkinson's disease Father        Deceased   Healthy Sister    Heart attack Maternal Grandmother    Heart attack Maternal Grandfather    Healthy Daughter    Healthy Son    Colon polyps Neg Hx    Colon cancer Neg Hx    Esophageal cancer Neg Hx    Rectal cancer Neg Hx    Stomach cancer Neg Hx     Social  History   Socioeconomic History   Marital status: Married    Spouse name: Not on file   Number of children: Not on file   Years of education: Not on file   Highest education level: Not on file  Occupational History   Not on file  Tobacco Use   Smoking status: Never   Smokeless tobacco: Never  Substance and Sexual Activity   Alcohol use: Yes    Alcohol/week: 7.0 standard drinks of alcohol    Types: 7 Standard drinks or equivalent per week    Comment: 1 drink of either beer/wine/liquor   Drug use: No   Sexual activity: Not on file  Other Topics Concern   Not on file  Social History Narrative   Lives with wife.   He is president of a Medical sales representative, Adult nurse, navigation system)   Highest level of education:  PhD   Social Determinants of Corporate investment banker Strain: Not on [employer] Insecurity: Not on file  Transportation Needs:  Not on file  Physical Activity: Not on file  Stress: Not on file  Social Connections: Not on file  Intimate Partner Violence: Not on file    Review of Systems:  All other review of systems negative except as mentioned in the HPI.  Physical Exam: Vital signs BP 139/75   Pulse 65   Temp (!) 97 F (36.1 C)   Resp 11   Ht 6' (1.829 m)   Wt 187 lb (84.8 kg)   SpO2 100%   BMI 25.36 kg/m   General:   Alert,  Well-developed, well-nourished, pleasant and cooperative in NAD Lungs:  Clear throughout to auscultation.   Heart:  Regular rate and rhythm; no murmurs, clicks, rubs,  or gallops. Abdomen:  Soft, nontender and nondistended. Normal bowel sounds.   Neuro/Psych:  Alert and cooperative. Normal mood and affect. A and O x 3   @Pawel Soules  Sena Slate, MD, [facility] [phone] (pager) 01/11/2023 9:47 AM@

## 2023-01-11 NOTE — Progress Notes (Signed)
Report given to PACU, vss 

## 2023-01-12 ENCOUNTER — Telehealth: Payer: Self-pay | Admitting: *Deleted

## 2023-01-12 NOTE — Telephone Encounter (Signed)
Post procedure follow up phone call. No answer at number given.  Left message on voicemail.  

## 2023-01-31 ENCOUNTER — Other Ambulatory Visit (HOSPITAL_BASED_OUTPATIENT_CLINIC_OR_DEPARTMENT_OTHER): Payer: Self-pay | Admitting: Internal Medicine

## 2023-02-01 ENCOUNTER — Ambulatory Visit: Payer: Medicare Other | Admitting: Physician Assistant

## 2023-02-07 LAB — LIPID PANEL
Chol/HDL Ratio: 1.9 {ratio} (ref 0.0–5.0)
Cholesterol, Total: 107 mg/dL (ref 100–199)
HDL: 55 mg/dL (ref >39)
LDL Chol Calc (NIH): 38 mg/dL (ref 0–99)
Triglycerides: 66 mg/dL (ref 0–149)
VLDL Cholesterol Cal: 14 mg/dL (ref 5–40)

## 2023-02-07 LAB — LIPOPROTEIN A (LPA): Lipoprotein (a): 20.5 nmol/L (ref <75.0)

## 2023-02-13 ENCOUNTER — Ambulatory Visit: Payer: Medicare Other | Attending: Internal Medicine | Admitting: Internal Medicine

## 2023-02-13 ENCOUNTER — Encounter: Payer: Self-pay | Admitting: Internal Medicine

## 2023-02-13 VITALS — BP 126/76 | HR 63 | Ht 75.0 in | Wt 189.0 lb

## 2023-02-13 DIAGNOSIS — E785 Hyperlipidemia, unspecified: Secondary | ICD-10-CM

## 2023-02-13 DIAGNOSIS — Z8249 Family history of ischemic heart disease and other diseases of the circulatory system: Secondary | ICD-10-CM | POA: Diagnosis present

## 2023-02-13 DIAGNOSIS — R931 Abnormal findings on diagnostic imaging of heart and coronary circulation: Secondary | ICD-10-CM

## 2023-02-13 NOTE — Patient Instructions (Addendum)
Medication Instructions:  No changes  *If you need a refill on your cardiac medications before your next appointment, please call your pharmacy*   Lab Work:  Fasting lipid  prior to appointment in Nov 2025   If you have labs (blood work) drawn today and your tests are completely normal, you will receive your results only by: MyChart Message (if you have MyChart) OR A paper copy in the mail If you have any lab test that is abnormal or we need to change your treatment, we will call you to review the results.   Testing/Procedures: Not needed   Follow-Up: At [facility], you and your health needs are our priority.  As part of our continuing mission to provide you with exceptional heart care, we have created designated Provider Care Teams.  These Care Teams include your primary Cardiologist (physician) and Advanced Practice Providers (APPs -  Physician Assistants and Nurse Practitioners) who all work together to provide you with the care you need, when you need it.     Your next appointment:   12 month(s)  The format for your next appointment:   In Person  Provider:   None    Other Instructions

## 2023-02-13 NOTE — Progress Notes (Signed)
LIPID CLINIC CONSULT NOTE  Chief Complaint:  Follow-up dyslipidemia, high CAC score  Primary Care Physician: Tisovec, Adelfa Koh, MD  Primary Cardiologist:  None  HPI:  John Hendricks is a 67 y.o. male who is being seen today for the evaluation of dyslipidemia at the request of Tisovec, Adelfa Koh, MD.  This is a pleasant 67 year old male kindly referred by Dr. Wylene Simmer for evaluation management of dyslipidemia and high CAC score.  Mr. John Hendricks is self-employed at a [employer] and works from home and is fairly sedentary job.  Past medical history is not that significant.  More recently established care at [facility].  During his physical was noted to have elevated cholesterol.  His total cholesterol was 264, triglycerides 222, HDL 54 and LDL 166.  Non-HDL cholesterol 210.  Subsequently was started on rosuvastatin 10 mg daily which is tolerated for the past several months as well as low-dose aspirin 81 mg daily.  Family history significant for MI in his mother possibly in her 42s or 56s he received several stents and ultimately had one of the first TAVR procedures.  She is now deceased.  His grandparents both had coronary artery disease.  Despite this he reports being asymptomatic.  He said this past weekend he ripped out some foundation bushes and was able to do digging and working with a hatchet for about 45 minutes before he became fatigued.  That being said, he had recent coronary artery calcium scoring which was significantly abnormal indicating a total calcium score of 733, 92nd percentile for age and sex matched control.  He was noted to have multivessel CAD.  Additionally had some granulomatous nodules in his lungs.  I discussed the significance of his calcium score today suggesting there is early onset heart disease.  We also discussed the ischemia trial which he actually has previously read and agreed that maximal medical therapy is likely the best first route in this  situation.  In fact no longer recommend stress testing in these patients despite high calcium scores if they are completely asymptomatic.  03/26/2020  Mr. John Hendricks returns today for follow-up.  He had repeat lipids drawn which show an increase in LDL cholesterol compared to levels about 2 months ago.  His total is 169 up from 160 and LDL of 96 up from 86.  He remains on 10 mg rosuvastatin.  As mentioned above his calcium score was quite high with the 2nd percentile for age and sex matched control.  He remains asymptomatic.  It is difficult to know what the changes in his lipids other than possibly less physical activity.  Diet is been fairly stable  07/16/2020  Mr. John Hendricks is seen today in follow-up.  Overall he says he is doing well.  He has an appointment with Dr. Wylene Simmer this summer.  We are working on dietary changes to help with additional lipid lowering and I increased his rosuvastatin to 40 mg daily to try to target LDL less than 70, given extensive coronary calcification.  He is recent lipids showed total cholesterol 156, triglycerides 92, HDL 53 and LDL 86, still somewhat above target.  This is despite optimizing his diet and being fairly close to a normal weight.  We discussed options at this point.  11/30/2020  Mr. Handrick returns today for follow-up.  He continues to do well on the ezetimibe.  This seems to be well-tolerated.  His lipids have improved significantly.  His last total cholesterol was 130, triglycerides 97, HDL  59 and LDL 52 as of June 2022.  Blood pressure is well controlled.  He is asymptomatic.  01/19/2022  Mr. John Hendricks returns today for follow-up.  He continues to do well on combination of ezetimibe and rosuvastatin.  Recent lipids this July showed total cholesterol 119, triglycerides 102, HDL 54 and LDL 45.  I do not have any evidence that he has had a prior LP(a) assessed.  I suspect he does have a genetic cholesterol disorder given his extensive coronary calcification.  I would  like him to get this checked at some point although it may not change management at this time, he might be a candidate for future/emerging therapies.  02/13/2023  Mr. John Hendricks is seen today in follow-up.  He has had further significant reduction in his cholesterol.  LDL last year was at 45, down from 86 previously.  His LDL now is at 38.  He says he is made some mild dietary changes some increase in exercise activity.  He had an LP(a) which was assessed it was negative.  He denies any side effects of medications.  He has had no chest pain or shortness of breath but did have a high calcium score.  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    Past Surgical History:  Procedure Laterality Date   COLONOSCOPY     MOS surgery     TOOTH EXTRACTION     VASECTOMY      FAMHx:  Family History  Problem Relation Age of Onset   Heart attack Mother        Mid-60s   Dementia Mother        Living   Breast cancer Mother    Heart disease Mother    Parkinson's disease Father        Deceased   Healthy Sister    Heart attack Maternal Grandmother    Heart attack Maternal Grandfather    Healthy Daughter    Healthy Son    Colon polyps Neg Hx    Colon cancer Neg Hx    Esophageal cancer Neg Hx    Rectal cancer Neg Hx    Stomach cancer Neg Hx     SOCHx:   reports that he has never smoked. He has never used smokeless tobacco. He reports current alcohol use of about 7.0 standard drinks of alcohol per week. He reports that he does not use drugs.  ALLERGIES:  No Known Allergies  ROS: Pertinent items noted in HPI and remainder of comprehensive ROS otherwise negative.  HOME MEDS: Current Outpatient Medications on File Prior to Visit  Medication Sig Dispense Refill   aspirin 81 MG EC tablet      ezetimibe  (ZETIA) 10 MG tablet TAKE 1 TABLET BY MOUTH EVERY DAY 90 tablet 0   rosuvastatin (CRESTOR) 40 MG tablet Take 1 tablet (40 mg total) by mouth daily. 90 tablet 3   No current facility-administered medications on file prior to visit.    LABS/IMAGING: No results found for this or any previous visit (from the past 48 hour(s)).  No results found.  LIPID PANEL:    Component Value Date/Time   CHOL 107 02/06/2023 0836   TRIG 66 02/06/2023 0836   HDL 55 02/06/2023 0836  CHOLHDL 1.9 02/06/2023 0836   CHOLHDL 5 07/09/2017 1528   VLDL 43.8 (H) 07/09/2017 1528   LDLCALC 38 02/06/2023 0836   LDLDIRECT 152.0 07/09/2017 1528    WEIGHTS: Wt Readings from Last 3 Encounters:  02/13/23 189 lb (85.7 kg)  01/11/23 187 lb (84.8 kg)  12/27/22 187 lb (84.8 kg)    VITALS: BP 126/76 (BP Location: Left Arm, Patient Position: Sitting, Cuff Size: Normal)   Pulse 63   Ht 6\' 3"  (1.905 m)   Wt 189 lb (85.7 kg)   SpO2 100%   BMI 23.62 kg/m   EXAM: Deferred  EKG: Deferred  ASSESSMENT: Mixed dyslipidemia, goal LDL less than 70 Multivessel coronary artery disease with CAC score of 733, the 92nd percentile for age and sex matched control Family history of premature coronary disease in his mother and grandparents Sedentary lifestyle  PLAN: 1.   Mr. Ostaszewski continues to have a low cholesterol.  In actuality his lipids showed his LDL was 45 last year down down to 38.  His target is less than 70 and it may be possible for him to come off the ezetimibe at the level he is currently achieving if it remains stable.  After some discussion we decided to continue the current meds but will reassess this in 1 year.  He will likely have follow-up with his PCP and this could be discussed further.  He was advised to contact me if you develop any coronary symptoms of concern.  Follow-up with me annually or sooner as necessary.  Chrystie Nose, MD, [facility], FACP  [place]  [facility]  Medical Director of  the Advanced Lipid Disorders &  Cardiovascular Risk Reduction Clinic Diplomate of the American Board of Clinical Lipidology Attending Cardiologist  Direct Dial: [phone]  Fax: [phone]  Website:  [email or web address] Nicely Kennis Buell 02/13/2023, 10:29 AM

## 2023-03-01 ENCOUNTER — Other Ambulatory Visit (HOSPITAL_BASED_OUTPATIENT_CLINIC_OR_DEPARTMENT_OTHER): Payer: Self-pay | Admitting: Internal Medicine

## 2023-03-26 ENCOUNTER — Other Ambulatory Visit: Payer: Self-pay | Admitting: Urology

## 2023-03-26 DIAGNOSIS — C61 Malignant neoplasm of prostate: Secondary | ICD-10-CM

## 2023-05-01 ENCOUNTER — Other Ambulatory Visit (HOSPITAL_BASED_OUTPATIENT_CLINIC_OR_DEPARTMENT_OTHER): Payer: Self-pay | Admitting: Internal Medicine

## 2023-05-10 ENCOUNTER — Other Ambulatory Visit: Payer: Medicare Other

## 2023-05-30 ENCOUNTER — Ambulatory Visit
Admission: RE | Admit: 2023-05-30 | Discharge: 2023-05-30 | Disposition: A | Discharge status: Home / Self Care | Payer: Medicare Other | Source: Ambulatory Visit | Attending: Urology

## 2023-05-30 DIAGNOSIS — C61 Malignant neoplasm of prostate: Secondary | ICD-10-CM

## 2023-05-30 MED ORDER — GADOPICLENOL 0.5 MMOL/ML IV SOLN
9.0000 mL | Freq: Once | INTRAVENOUS | Status: AC | PRN
  Administered 2023-05-30: 9 mL via INTRAVENOUS

## 2024-01-22 ENCOUNTER — Other Ambulatory Visit (HOSPITAL_BASED_OUTPATIENT_CLINIC_OR_DEPARTMENT_OTHER): Payer: Self-pay | Admitting: Internal Medicine

## 2024-02-06 ENCOUNTER — Encounter (HOSPITAL_BASED_OUTPATIENT_CLINIC_OR_DEPARTMENT_OTHER): Payer: Self-pay

## 2024-02-06 ENCOUNTER — Other Ambulatory Visit (HOSPITAL_BASED_OUTPATIENT_CLINIC_OR_DEPARTMENT_OTHER): Payer: Self-pay

## 2024-02-12 ENCOUNTER — Other Ambulatory Visit: Payer: Self-pay | Admitting: *Deleted

## 2024-02-12 ENCOUNTER — Other Ambulatory Visit (HOSPITAL_BASED_OUTPATIENT_CLINIC_OR_DEPARTMENT_OTHER): Payer: Self-pay | Admitting: Internal Medicine

## 2024-02-12 DIAGNOSIS — E785 Hyperlipidemia, unspecified: Secondary | ICD-10-CM

## 2024-02-12 DIAGNOSIS — Z8249 Family history of ischemic heart disease and other diseases of the circulatory system: Secondary | ICD-10-CM

## 2024-02-12 DIAGNOSIS — R931 Abnormal findings on diagnostic imaging of heart and coronary circulation: Secondary | ICD-10-CM

## 2024-02-12 LAB — LIPID PANEL
Chol/HDL Ratio: 2.1 ratio (ref 0.0–5.0)
Cholesterol, Total: 117 mg/dL (ref 100–199)
HDL: 56 mg/dL (ref >39)
LDL Chol Calc (NIH): 44 mg/dL (ref 0–99)
Triglycerides: 85 mg/dL (ref 0–149)
VLDL Cholesterol Cal: 17 mg/dL (ref 5–40)

## 2024-02-12 NOTE — Progress Notes (Signed)
 Cardiology Office Note   Date:  02/20/2024  ID:  John, Hendricks 05-16-1955, MRN [id number] PCP: Vernadine Charlie LELON, MD  [facility] Providers Cardiologist:  Vinie JAYSON Maxcy, MD     [facility] Dyslipidemia Coronary artery diseae CT Calcium  score 10/24/2019 CAC score 733 (92nd percentile) LM 3, LAD 251, LCx 307, RCA 172 Family history ASCVD  Referred to advanced lipid disorders clinic and seen by Dr. Maxcy 01/01/2020.  He is self-employed at [employer] working from home, which is fairly sedentary.  He had recently establish care at [facility] and was found to have elevated cholesterol. Was started on rosuvastatin  10 mg daily and aspirin 81 mg daily.  Family history significant for MI in his mother possibly in her 75s or 26s who received several stents and ultimately had one of the first TAVR procedures.  Unfortunately, she is now deceased.  Grandparents both had coronary artery disease. Had calcium  score of 733, 92nd percentile for age/sex matched controls with multivessel CAD.  He also had granulomatous nodules in his lungs.  He was asymptomatic.  At follow-up visit 03/26/2020 LDL increased from 86 to 96.  He reported stable diet, but may be less physical activity.  He remained asymptomatic.  Atorvastatin was increased to 40 mg daily to target LDL less than 70.  At office visit 01/19/2022, ezetimibe  had been added and lipid panel from July revealed total cholesterol 119, triglycerides 102, HDL 54, and LDL 45.  Last clinic visit was 02/13/2023 with Dr. Maxcy.  LDL was down to 38.  He had made dietary changes as well as increased his physical activity.  LP(a) was negative.  He was not having any concerning side effects on medication and denied concerning cardiac symptoms despite having a high calcium  score.  History of Present Illness Discussed the use of AI scribe software for clinical note transcription with the patient, who gave verbal consent to  proceed.  History of Present Illness John Hendricks is a very pleasant 68 year old male who is here today for follow-up of dyslipidemia.   Lab results from 02/12/2024 reveal well-controlled lipids with total cholesterol 117, triglycerides 85, HDL 56, and LDL-C 44. He is on Crestor  40 mg and ezetimibe  10 mg, with an LDL reduction from the 80s-90s to 44 mg/dL. CT calcium  score from 2021 is 733 (92nd percentile)  He maintains a healthy diet, cooking at home daily, and avoids fast food, soda, and sweetened beverages. He walks a couple of miles most days, though his wife's cancer diagnosis has limited his daily activities. He asks about parameters for continuing medication.  When asked about any concerning symptoms, reports he experiences general stiffness that improves with walking. No chest pain, shortness of breath, palpitations, orthopnea, PND, edema, presyncope, syncope.    ROS: See HPI  Studies Reviewed EKG Interpretation Date/Time:  Wednesday February 20 2024 10:29:42 EST Ventricular Rate:  68 PR Interval:  150 QRS Duration:  78 QT Interval:  390 QTC Calculation: 414 R Axis:   34  Text Interpretation: Normal sinus rhythm Normal ECG No previous ECGs available Confirmed by Percy Browning [phone]) on 02/20/2024 11:04:26 AM     Lipoprotein (a)  Date/Time Value Ref Range Status  02/06/2023 08:36 AM 20.5 <75.0 nmol/L Final    Comment:    Note:  Values greater than or equal to 75.0 nmol/L may        indicate an independent risk factor for CHD,  but must be evaluated with caution when applied        to non-Caucasian populations due to the        influence of genetic factors on Lp(a) across        ethnicities.     Risk Assessment/Calculations           Physical Exam VS:  BP 130/72   Pulse 69   Ht 6' 3 (1.905 m)   Wt 193 lb 6.4 oz (87.7 kg)   SpO2 99%   BMI 24.17 kg/m    Wt Readings from Last 3 Encounters:  02/20/24 193 lb 6.4 oz (87.7 kg)  02/13/23 189 lb (85.7 kg)   01/11/23 187 lb (84.8 kg)    GEN: Well nourished, well developed in no acute distress NECK: No JVD; No carotid bruits CARDIAC: RRR, no murmurs, rubs, gallops RESPIRATORY:  Clear to auscultation without rales, wheezing or rhonchi  ABDOMEN: Soft, non-tender, non-distended EXTREMITIES:  No edema; No deformity    Assessment & Plan Hyperlipidemia LDL goal < 70 Generalized stiffness Lipid panel 02/12/2024 reveals well-controlled lipids with total cholesterol 117, triglycerides 85, HDL 56, and LDL-C 44. He asks about parameters for reducing lipid lowering therapy as he is experiencing generalized stiffness. Stiffness tends to improve with walking. Advised it could be secondary to arthritis.  - Advised that starting Coenzyme Q10 could provide improvement for joint discomfort - Monitor stiffness and report if it worsens or impacts quality of life - Consider reducing rosuvastatin  to 20 mg daily if discomfort worsens - Continue rosuvastatin  40 mg and ezetimibe  10 mg daily at this time - Repeat lipid panel in 1 year, sooner if adjustments are made to lipid lowering therapy - Heart healthy diet and regular physical activity recommended  CAD Cardiac risk History of significantly elevated CT calcium  score in 2021 with CAC> 700 placing him in the 92nd percentile compared to age/sex matched controls. He denies chest pain, dyspnea, or other symptoms concerning for angina. EKG reveals NSR with no ST/T abnormality. No indication for further ischemic evaluation at this time - Continue regular physical activity aiming for at least 150 minutes of moderate intensity exercise each week - Report concerning cardiac symptoms if they occur -Continue heart healthy, whole food diet avoiding saturated fat, processed food, sugar, and other simple carbohydrates - Continue aspirin, ezetimibe , rosuvastatin         Dispo: 1 year with Dr. Mona or me  Signed, Rosaline Bane, NP-C

## 2024-02-13 ENCOUNTER — Encounter (HOSPITAL_BASED_OUTPATIENT_CLINIC_OR_DEPARTMENT_OTHER): Admitting: Nurse Practitioner

## 2024-02-19 ENCOUNTER — Ambulatory Visit: Payer: Self-pay | Admitting: Internal Medicine

## 2024-02-20 ENCOUNTER — Ambulatory Visit (INDEPENDENT_AMBULATORY_CARE_PROVIDER_SITE_OTHER): Admitting: Nurse Practitioner

## 2024-02-20 ENCOUNTER — Encounter (HOSPITAL_BASED_OUTPATIENT_CLINIC_OR_DEPARTMENT_OTHER): Payer: Self-pay | Admitting: Nurse Practitioner

## 2024-02-20 VITALS — BP 130/72 | HR 69 | Ht 75.0 in | Wt 193.4 lb

## 2024-02-20 DIAGNOSIS — E785 Hyperlipidemia, unspecified: Secondary | ICD-10-CM | POA: Diagnosis not present

## 2024-02-20 DIAGNOSIS — M2560 Stiffness of unspecified joint, not elsewhere classified: Secondary | ICD-10-CM

## 2024-02-20 DIAGNOSIS — R931 Abnormal findings on diagnostic imaging of heart and coronary circulation: Secondary | ICD-10-CM | POA: Diagnosis not present

## 2024-02-20 DIAGNOSIS — I2510 Atherosclerotic heart disease of native coronary artery without angina pectoris: Secondary | ICD-10-CM

## 2024-02-20 DIAGNOSIS — Z7189 Other specified counseling: Secondary | ICD-10-CM

## 2024-02-20 MED ORDER — ROSUVASTATIN CALCIUM 40 MG PO TABS: 40.0000 mg | ORAL_TABLET | Freq: Every day | ORAL | 3 refills | Status: AC

## 2024-02-20 MED ORDER — COQ10 100 MG PO CAPS: 100.0000 mg | ORAL_CAPSULE | Freq: Every day | ORAL | Status: AC

## 2024-02-20 MED ORDER — EZETIMIBE 10 MG PO TABS: 10.0000 mg | ORAL_TABLET | Freq: Every day | ORAL | 3 refills | Status: AC

## 2024-02-20 NOTE — Patient Instructions (Signed)
.  Medication Instructions:  START COQ10 100 MG DAILY, OVER THE COUNTER  *If you need a refill on your cardiac medications before your next appointment, please call your pharmacy*  Lab Work: NONE  Testing/Procedures: NONE  Follow-Up: At [facility], you and your health needs are our priority.  As part of our continuing mission to provide you with exceptional heart care, our providers are all part of one team.  This team includes your primary Cardiologist (physician) and Advanced Practice Providers or APPs (Physician Assistants and Nurse Practitioners) who all work together to provide you with the care you need, when you need it.  Your next appointment:   12 month(s)  Provider:   K. Chad Hilty, MD or Rosaline Bane, NP   We recommend signing up for the patient portal called MyChart.  Sign up information is provided on this After Visit Summary.  MyChart is used to connect with patients for Virtual Visits (Telemedicine).  Patients are able to view lab/test results, encounter notes, upcoming appointments, etc.  Non-urgent messages can be sent to your provider as well.   To learn more about what you can do with MyChart, go to [email or web address].
